# Patient Record
Sex: Male | Born: 1945 | Race: Black or African American | Hispanic: No | Marital: Married | Smoking: Current every day smoker
Health system: Southern US, Community
[De-identification: ages and names within clinical notes are randomized; demographics above are authoritative.]

## PROBLEM LIST (undated history)

## (undated) DIAGNOSIS — I10 Essential (primary) hypertension: Secondary | ICD-10-CM

## (undated) DIAGNOSIS — K219 Gastro-esophageal reflux disease without esophagitis: Secondary | ICD-10-CM

## (undated) DIAGNOSIS — F1010 Alcohol abuse, uncomplicated: Secondary | ICD-10-CM

## (undated) DIAGNOSIS — E785 Hyperlipidemia, unspecified: Secondary | ICD-10-CM

## (undated) DIAGNOSIS — I519 Heart disease, unspecified: Secondary | ICD-10-CM

## (undated) DIAGNOSIS — I2510 Atherosclerotic heart disease of native coronary artery without angina pectoris: Secondary | ICD-10-CM

## (undated) HISTORY — DX: Atherosclerotic heart disease of native coronary artery without angina pectoris: I25.10

## (undated) HISTORY — PX: CAROTID STENT: SHX1301

## (undated) HISTORY — PX: SHOULDER SURGERY: SHX246

## (undated) HISTORY — DX: Gastro-esophageal reflux disease without esophagitis: K21.9

## (undated) HISTORY — DX: Essential (primary) hypertension: I10

## (undated) HISTORY — DX: Alcohol abuse, uncomplicated: F10.10

## (undated) HISTORY — DX: Hyperlipidemia, unspecified: E78.5

## (undated) HISTORY — PX: APPENDECTOMY: SHX54

## (undated) HISTORY — DX: Heart disease, unspecified: I51.9

---

## 2003-10-10 HISTORY — PX: CARDIAC CATHETERIZATION: SHX172

## 2003-10-10 HISTORY — PX: CORONARY ANGIOPLASTY WITH STENT PLACEMENT: SHX49

## 2013-10-09 DIAGNOSIS — I2510 Atherosclerotic heart disease of native coronary artery without angina pectoris: Secondary | ICD-10-CM

## 2013-10-09 HISTORY — DX: Atherosclerotic heart disease of native coronary artery without angina pectoris: I25.10

## 2014-08-03 ENCOUNTER — Encounter: Payer: Self-pay | Admitting: Cardiovascular Disease

## 2014-08-03 ENCOUNTER — Inpatient Hospital Stay: Payer: Self-pay | Admitting: Internal Medicine

## 2014-08-03 DIAGNOSIS — I200 Unstable angina: Secondary | ICD-10-CM

## 2014-08-03 DIAGNOSIS — I25110 Atherosclerotic heart disease of native coronary artery with unstable angina pectoris: Secondary | ICD-10-CM

## 2014-08-03 HISTORY — PX: CORONARY ANGIOPLASTY WITH STENT PLACEMENT: SHX49

## 2014-08-03 HISTORY — PX: CARDIAC CATHETERIZATION: SHX172

## 2014-08-03 LAB — CK TOTAL AND CKMB (NOT AT ARMC)
CK, TOTAL: 425 U/L — AB
CK, Total: 408 U/L — ABNORMAL HIGH
CK, Total: 300 U/L
CK-MB: 4.8 ng/mL — ABNORMAL HIGH (ref 0.5–3.6)
CK-MB: 4.5 ng/mL — AB (ref 0.5–3.6)
CK-MB: 3.4 ng/mL (ref 0.5–3.6)

## 2014-08-03 LAB — COMPREHENSIVE METABOLIC PANEL
Albumin: 3.4 g/dL (ref 3.4–5.0)
Alkaline Phosphatase: 90 U/L
Anion Gap: 11 (ref 7–16)
BILIRUBIN TOTAL: 0.2 mg/dL (ref 0.2–1.0)
BUN: 15 mg/dL (ref 7–18)
CO2: 27 mmol/L (ref 21–32)
CREATININE: 0.8 mg/dL (ref 0.60–1.30)
Calcium, Total: 8.1 mg/dL — ABNORMAL LOW (ref 8.5–10.1)
Chloride: 107 mmol/L (ref 98–107)
GLUCOSE: 87 mg/dL (ref 65–99)
Osmolality: 289 (ref 275–301)
Potassium: 3.2 mmol/L — ABNORMAL LOW (ref 3.5–5.1)
SGOT(AST): 33 U/L (ref 15–37)
SGPT (ALT): 17 U/L
Sodium: 145 mmol/L (ref 136–145)
Total Protein: 6.9 g/dL (ref 6.4–8.2)

## 2014-08-03 LAB — CBC
HCT: 38 % — ABNORMAL LOW (ref 40.0–52.0)
HGB: 12 g/dL — ABNORMAL LOW (ref 13.0–18.0)
MCH: 27 pg (ref 26.0–34.0)
MCHC: 31.7 g/dL — AB (ref 32.0–36.0)
MCV: 85 fL (ref 80–100)
PLATELETS: 230 10*3/uL (ref 150–440)
RBC: 4.46 10*6/uL (ref 4.40–5.90)
RDW: 14.8 % — AB (ref 11.5–14.5)
WBC: 9.3 10*3/uL (ref 3.8–10.6)

## 2014-08-03 LAB — PRO B NATRIURETIC PEPTIDE: B-Type Natriuretic Peptide: 260 pg/mL — ABNORMAL HIGH (ref 0–125)

## 2014-08-03 LAB — TROPONIN I
TROPONIN-I: 0.04 ng/mL
Troponin-I: 0.07 ng/mL — ABNORMAL HIGH

## 2014-08-03 LAB — PROTIME-INR
INR: 0.9
Prothrombin Time: 11.7 secs (ref 11.5–14.7)

## 2014-08-04 ENCOUNTER — Other Ambulatory Visit: Payer: Self-pay | Admitting: Physician Assistant

## 2014-08-04 DIAGNOSIS — I214 Non-ST elevation (NSTEMI) myocardial infarction: Secondary | ICD-10-CM

## 2014-08-04 LAB — COMPREHENSIVE METABOLIC PANEL
ALT: 13 U/L — AB
ANION GAP: 6 — AB (ref 7–16)
Albumin: 2.8 g/dL — ABNORMAL LOW (ref 3.4–5.0)
Alkaline Phosphatase: 60 U/L
BILIRUBIN TOTAL: 0.5 mg/dL (ref 0.2–1.0)
BUN: 11 mg/dL (ref 7–18)
CHLORIDE: 111 mmol/L — AB (ref 98–107)
CO2: 26 mmol/L (ref 21–32)
CREATININE: 0.76 mg/dL (ref 0.60–1.30)
Calcium, Total: 7.6 mg/dL — ABNORMAL LOW (ref 8.5–10.1)
Glucose: 94 mg/dL (ref 65–99)
Osmolality: 284 (ref 275–301)
Potassium: 3.6 mmol/L (ref 3.5–5.1)
SGOT(AST): 25 U/L (ref 15–37)
SODIUM: 143 mmol/L (ref 136–145)
Total Protein: 5.8 g/dL — ABNORMAL LOW (ref 6.4–8.2)

## 2014-08-04 LAB — CBC WITH DIFFERENTIAL/PLATELET
Basophil #: 0.1 10*3/uL (ref 0.0–0.1)
Basophil %: 0.8 %
EOS ABS: 0.4 10*3/uL (ref 0.0–0.7)
Eosinophil %: 5.1 %
HCT: 35.8 % — ABNORMAL LOW (ref 40.0–52.0)
HGB: 11.4 g/dL — ABNORMAL LOW (ref 13.0–18.0)
LYMPHS ABS: 1.4 10*3/uL (ref 1.0–3.6)
Lymphocyte %: 17.2 %
MCH: 27.7 pg (ref 26.0–34.0)
MCHC: 32 g/dL (ref 32.0–36.0)
MCV: 87 fL (ref 80–100)
MONOS PCT: 6.3 %
Monocyte #: 0.5 x10 3/mm (ref 0.2–1.0)
NEUTROS PCT: 70.6 %
Neutrophil #: 5.8 10*3/uL (ref 1.4–6.5)
Platelet: 201 10*3/uL (ref 150–440)
RBC: 4.14 10*6/uL — AB (ref 4.40–5.90)
RDW: 15.3 % — ABNORMAL HIGH (ref 11.5–14.5)
WBC: 8.3 10*3/uL (ref 3.8–10.6)

## 2014-08-04 LAB — LIPID PANEL
Cholesterol: 122 mg/dL (ref 0–200)
HDL: 47 mg/dL (ref 40–60)
LDL CHOLESTEROL, CALC: 57 mg/dL (ref 0–100)
TRIGLYCERIDES: 89 mg/dL (ref 0–200)
VLDL CHOLESTEROL, CALC: 18 mg/dL (ref 5–40)

## 2014-08-04 NOTE — Progress Notes (Signed)
Please set patient up for cardiac rehab. Thanks!

## 2014-08-05 ENCOUNTER — Telehealth: Payer: Self-pay | Admitting: *Deleted

## 2014-08-05 NOTE — Telephone Encounter (Signed)
Order faxed for cardiac rehab

## 2015-01-30 NOTE — Discharge Summary (Signed)
PATIENT NAME:  Bryan Calderon, Bryan Calderon MR#:  [id number] DATE OF BIRTH:  08-May-1946  DATE OF ADMISSION:  08/03/2014 DATE OF DISCHARGE:  08/04/2014  DISCHARGE DIAGNOSES: 1.  Non-ST elevation myocardial infarction, status post catheterization and percutaneous coronary intervention with stent.  2.  Hypertension.  3.  Hyperlipidemia.  4.  Ongoing tobacco abuse.   CONSULTATIONS: Muhammed A. Arida, cardiology.   PROCEDURE: Cardiac catheterization by Dr. Fletcher Anon performed 08/03/2014, showing severe one-vessel coronary artery disease with mid left anterior descending artery in-stent restenosis, moderate left circumflex and right coronary artery disease. Drug-eluting stent performed on the 90% lesion in the mid left anterior descending artery. Following the procedure, there was 0% residual stenosis.   HISTORY OF PRESENT ILLNESS:  This very pleasant 69 year old African-American gentleman with past medical history of coronary artery disease status post stenting 10 years prior to presentation, hypertension, and hyperlipidemia presents with an episode of midsternal chest pain with radiation to the shoulders and arms.   HOSPITAL COURSE BY PROBLEM:  1.  NSTEMI:  EKG on presentation was highly abnormal with deeply inverted T waves in anterior leads. Cardiac enzymes were slightly positive with troponin increasing from 0.02 to 0.07 prior to procedure. The patient was seen by Dr. Fletcher Anon, who took him to the catheterization lab, where a 90% in-stent stenosis of the prior LAD stent was diagnosed and treated with placement of a new drug-eluting stent. After the procedure, the patient was chest pain-free. He was discharged on Effient and aspirin. He continues on lisinopril, Lipitor, and metoprolol. He is in the process of moving from [place] to [place]. He is only in [place] on the weekends, so he will be following up with his primary cardiologist within 1 week of discharge. He was given contact information for our local  cardiologists in case he needed to contact them while he is in [place]. Once he establishes in [place], he will make an appointment with either Dr. Fletcher Anon or Dr. Rockey Situ, who saw him in the hospital.  2.  Hypertension:  The patient will continue on hydrochlorothiazide, lisinopril, and metoprolol. This will be monitored by his primary cardiologist and primary care physician as an outpatient.  3.  Hyperlipidemia:  LDL was 57, total cholesterol 122. He continues on Lipitor.  4.  Ongoing tobacco abuse:  Cessation counseling provided while inpatient. The patient reports that he has quit smoking as of this admission.   DISCHARGE PHYSICAL EXAMINATION: VITAL SIGNS:  Temperature 97.9, pulse 51, respirations 15, blood pressure 131/58, oxygenation 97% on room air.  GENERAL:  No acute distress.  CARDIOVASCULAR:  Regular rate and rhythm. No murmurs, rubs, or gallops. No peripheral edema. Peripheral pulses are 2+.  PULMONARY:  Lungs are clear to auscultation bilaterally with good air movement.   LABORATORY DATA:  Sodium was 143, potassium 3.6, chloride 101, bicarbonate 26, BUN 11, creatinine 0.76, glucose 94. LFTs were normal. White blood cells were 8.3, hemoglobin 11.4, platelets 201,000, and MCV 87.   DISCHARGE MEDICATIONS: 1.  Hydrochlorothiazide 25 mg 1 tablet daily.  2.  Lisinopril 10 mg 1 tablet daily.  3.  Nitroglycerin 0.4 mg 1 tablet every 5 minutes as needed for angina.  4.  Atorvastatin 40 mg 1 tablet daily.  5.  Aspirin 81 mg 1 tablet daily.  6.  Prasugrel 10 mg 1 tablet daily.  7.  Metoprolol 50 mg 1 tablet twice a day.   CONDITION ON DISCHARGE:  Stable.   DISPOSITION:  Discharged to home with no home health requirements.   DISCHARGE  INSTRUCTIONS: DIET:  Heart healthy.  ACTIVITY:  As tolerated.  TIMEFRAME FOR APPOINTMENT:  Please make an appointment with your primary care physician within 1 to 2 weeks of discharge.   TIME SPENT ON DISCHARGE:  40 minutes.     ____________________________ Earleen Newport. Volanda Napoleon, MD cpw:nb D: 08/05/2014 14:58:25 ET T: 08/05/2014 23:03:39 ET JOB#: [id number]  cc: Barnetta Chapel P. Volanda Napoleon, MD, <Dictator> Aldean Jewett MD ELECTRONICALLY SIGNED 08/06/2014 14:25

## 2015-01-30 NOTE — H&P (Signed)
PATIENT NAME:  Bryan Calderon, Bryan Calderon MR#:  [id number] DATE OF BIRTH:  Jul 14, 1946  DATE OF ADMISSION:  08/03/2014  REFERRING PHYSICIAN:  Gretchen Short. Beather Arbour, MD   PRIMARY CARE PHYSICIAN:  Nonlocal in [place]:  Juluis Mire, MD   CHIEF COMPLAINT:  Midsternal chest pain.   HISTORY OF PRESENT ILLNESS:  Bryan Calderon is a 69 year old African-American male with past medical history significant for hypertension, coronary artery disease status post stent 10 years ago, and history of hyperlipidemia, who presents with the complaints of episode of midsternal chest pain with radiation to the shoulders and arm. The patient stated that at around 1:30 a.m. he developed sudden onset of severe midsternal chest pain that was sharp in nature with radiation to the right shoulder and arm and associated with mild shortness of breath. Hence, he came to the Emergency Room accompanied by his wife. En route to the Emergency Room, his chest pain kind of decreased significantly. There was mild associated diaphoresis with the chest pain episodes. Otherwise, denies any dizziness or loss of consciousness. No nausea. No vomiting. No diarrhea. No history of recent fever or cough. The patient stated that he has been having some chest pains on and off since about one week, one episode about a week ago and another episode yesterday in the afternoon, which kind of resolved on their own. The patient did not seek any medical attention for these 2 episodes.   In the Emergency Room, the patient was evaluated by the ED physician and found to have elevated blood pressure and was given nitroglycerin, aspirin, and subcutaneous Lovenox, following which his symptoms resolved, and currently he is chest pain-free.   PAST MEDICAL HISTORY: 1.  Hypertension, history of coronary artery disease status post stent, which was placed about 10 years ago.  2.  History of hyperlipidemia.   PAST SURGICAL HISTORY:  1.  Status post appendectomy.   2.  Status post surgery for right arm fracture.    HOME MEDICATIONS:   1.  Blood pressure medication. He believes that it is lisinopril, but does not know the dose of the medication.  2.  Cholesterol medication. He does not know the name of the medication.  3.  Hydrochlorothiazide.  4.  Aspirin.   ALLERGIES:  No known drug allergies.   SOCIAL HISTORY:  He is married and lives with his wife. He is a resident of [place] and planning to move to [place] shortly. 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 History of smoking half pack per day for the past many years. History of alcohol use: He drinks about 3 times a week, that is vodka. He also has a history of marijuana usage, last usage yesterday.    FAMILY HISTORY:  Nonsignificant for any cardiac or cerebrovascular disease.   REVIEW OF SYSTEMS:  CONSTITUTIONAL:  Negative for fever, fatigue, generalized weakness. No abnormal weight gain or weight loss.  EYES:  Negative for blurred vision or double vision. No pain. No redness. No inflammation.  EARS, NOSE, AND THROAT:  Negative for tinnitus, ear pain, hearing loss, epistaxis, nasal discharge, or difficulty swallowing.  RESPIRATORY:  Negative for cough, wheezing, shortness of breath, painful respirations, or hemoptysis.  CARDIOVASCULAR:  Retrosternal chest pain episode around 1:30 a.m. as noted in the HPI, positive mild shortness of breath with that chest pain episode. No diaphoresis. No dizziness. No loss of consciousness. No history of any paroxysmal nocturnal dyspnea or orthopnea. No leg edema.   GASTROINTESTINAL:  Negative for nausea, vomiting, diarrhea, abdominal pain, GERD symptoms, hematemesis, melena, or rectal bleeding.  GENITOURINARY:  Negative for dysuria, hematuria, frequency, or urgency.  ENDOCRINE:  Negative for polyuria or polydipsia. No heat or cold intolerance.  HEMATOLOGIC AND ONCOLOGIC:  Negative for anemia or easy bruising or bleeding.  SKIN:  Negative  for acne, skin rash, or lesions.  MUSCULOSKELETAL:  Negative for any back pain or neck pain. No history of any arthritis or joint swellings.  NEUROLOGICAL:  Negative for focal weakness or numbness. No history of CVA, TIA, or seizure disorder.  PSYCHIATRIC:  Negative for anxiety, insomnia, or depression.   PHYSICAL EXAMINATION: VITAL SIGNS:  Temperature 98.1 degrees Fahrenheit, pulse rate 60 per minute and regular, respirations 20 per minute, blood pressure 196/85, oxygen saturation 97% on room air.   GENERAL:  Well developed, well nourished, pleasant and cooperative, comfortably lying in the bed, not in any distress, alert, awake.  HEAD:  Atraumatic, normocephalic.  EYES:  Pupils are equal and reactive to light and accommodation. No conjunctival pallor. No scleral icterus. Extraocular movements are intact.  NOSE:  No nasal lesions. No drainage.  EARS:  No drainage. No external lesions.  ORAL CAVITY:  No mucosal lesions. No exudates. No lumps.  NECK:  Supple. No JVD. No thyromegaly. No carotid bruit. Range of motion is normal.  RESPIRATORY:  Good respiratory effort. Not using any accessory muscles of respiration. Bilateral vesicular breath sounds present. No rales or rhonchi present.  CARDIOVASCULAR: S1, S2 regular. No murmurs, gallops, or clicks. Pedal pulses equal at carotid, femoral, and pedal pulses. No peripheral edema.  GASTROINTESTINAL:  Abdomen is soft and nontender. No hepatosplenomegaly. Bowel sounds equal in all four quadrants present. No tenderness. No guarding. No rigidity.  GENITOURINARY:  Deferred.  MUSCULOSKELETAL:  No joint swelling or tenderness. No effusions. Range of motion is adequate. Strength and tone are equal bilaterally in upper and lower extremities.   SKIN:  Inspection within normal limits.  LYMPHATIC:  Negative for cervical lymphadenopathy.  VASCULAR:  Good dorsalis pedis and posterior tibial pulses.  NEUROLOGICAL:  Cranial nerves II through XII are grossly intact.  The patient is alert, awake, and oriented x 3. Deep tendon reflexes are 2+ bilaterally and symmetrical. Motor strength is 5/5 in both upper and lower extremities bilaterally.   PSYCHIATRIC:  Judgment and insight are adequate. Alert and oriented x 3. Memory and mood are within normal limits.    LABORATORY DATA:  BNP is 260, serum glucose 87, BUN 15, creatinine 0.8, serum sodium 145, potassium 3.2, chloride 107, bicarbonate 27, calcium 8.1, total protein 6.9, albumin 3.4, bilirubin 0.2, alkaline phosphatase 90, AST 33, and ALT 17. Troponin is less than 0.02. WBC is 9.3, hemoglobin 12.0, hematocrit 38.0, platelet count 230. Prothrombin time is 11.7, INR 0.9.   IMAGING STUDIES:  Chest x-ray:  No active cardiopulmonary disease.   EKG:  Normal sinus rhythm with ventricular rate of 67 beats per minute, right bundle branch block pattern, and T-wave inversions in V3 to V6.   ASSESSMENT AND PLAN:  A 68 year old African-American male with past medical history significant for hypertension, coronary artery disease status post stent, and hyperlipidemia, who presents with the complaints of retrosternal chest pain with radiation to the right arm/shoulder.   1.  Retrosternal chest pain concerning for unstable angina. Plan:  Admit to telemetry. Aspirin, beta blocker, nitroglycerin, subcutaneous Lovenox, statin, cycle cardiac enzymes, echocardiogram, and cardiology consultation.   2.  History of coronary artery disease status post stent. Plan:  Nitroglycerin, aspirin, beta blocker, statin, echocardiogram, cardiology consultation, cycle cardiac enzymes.  3.  Hypertension on medications. Blood pressure is high. Resume home medications, follow blood pressure monitoring, adjust medications further for better BP control.  4.  Hypokalemia, mild, likely secondary due to diuretic usage. Potassium supplementation, follow BMP.   5.  History of smoking, current. Counseled to quit smoking.   6.  History of marijuana usage. Check  urine drug screen. Counseled about quitting. Monitor.  7.  Deep vein thrombosis prophylaxis with subcutaneous heparin and subcutaneous Lovenox.  8.  Gastrointestinal prophylaxis with Protonix.   CODE STATUS:  Full code.   TIME SPENT:  55 minutes.   ____________________________ Juluis Mire, MD enr:nb D: 08/03/2014 05:22:26 ET T: 08/03/2014 05:40:44 ET JOB#: [id number]  cc: Juluis Mire, MD, <Dictator> Unknown cc  Juluis Mire MD ELECTRONICALLY SIGNED 08/06/2014 19:48

## 2015-01-30 NOTE — Consult Note (Signed)
PATIENT NAME:  Bryan Calderon, Bryan Calderon MR#:  [id number] DATE OF BIRTH:  10/11/1945  DATE OF CONSULTATION:  08/03/2014  REQUESTING PHYSICIAN:  Azucena Freed, MD   CONSULTING PHYSICIAN:  Samuele Storey A. Fletcher Anon, MD   REASON FOR CONSULTATION: Unstable angina.   HISTORY OF PRESENT ILLNESS: This is a 69 year old African American male with known history of coronary artery disease, status post PCI and stent placement 10 years ago in [place], hypertension, hyperlipidemia and tobacco use.  He is in the process of moving from [place] to [place]. He presented to the Emergency Room with severe substernal chest tightness radiating to his shoulder. This was sudden and happened around 1:30 in the morning.  The discomfort is described as heavy tightness and not sharp. It did not radiate to his back. It lasted for more than 1 hour. He had similar episodes in the last week, mostly at rest.  He complains of increased exertional dyspnea but he has not had chest pain with activities lately.  He was hypertensive on presentation. Symptoms improved with aspirin and nitroglycerin. He is currently chest pain free. He denies cocaine use. He has not missed any of his medications per his report.   PAST MEDICAL HISTORY:  1.  Coronary artery disease, status post stent placement more than 10 years ago with no cardiac event since then.  2.  Hypertension.  3.  Hyperlipidemia.  4.  Tobacco use.   HOME MEDICATIONS:  Not exactly known, but he is supposed to be on lisinopril, hydrochlorothiazide, aspirin, and cholesterol medication.   ALLERGIES: No known drug allergies.   SOCIAL HISTORY: He is married and lives with his wife. He is a resident of  [place] and planning to move to [place].  He works at the [employer] in [place].  He smokes 1 pack per day for many years.  He drinks 3 times per week mostly Vodka.  He admits to occasional marijuana use, but no cocaine.   FAMILY HISTORY: Negative for coronary artery disease.   REVIEW OF  SYSTEMS: A 10 point review of systems review of systems was performed.  It is negative other than what is mentioned in the history of present illness.   PHYSICAL EXAMINATION:  GENERAL: The patient is well-developed and appears to be at his stated age and in no acute distress.  VITAL SIGNS: On presentation, he was hypertensive with a blood pressure of 196/85. Currently his temperature is 97.7, pulse 56, respiratory rate 16, blood pressure is 148/68, and oxygen saturation is 98% on room air.  HEENT: Normocephalic, atraumatic.  NECK: No JVD or carotid bruits.  RESPIRATORY: Normal respiratory effort with no use of accessory muscles. Auscultation reveals normal breath sounds.  CARDIOVASCULAR: Normal PMI. Normal S1 and S2 with no gallops or murmurs.  ABDOMEN: Benign, nontender, and nondistended.  EXTREMITIES: No clubbing, cyanosis, or edema.  SKIN: Warm and dry with no rash.  PSYCHIATRIC: He is alert, oriented x 3 with normal mood and affect.   LABORATORY, DIAGNOSTIC, AND RADIOLOGICAL DATA:  His troponin was less than 0.02. CK-MB was slightly up at 4.8. Renal function is normal. Hemoglobin is 12. ECG showed sinus rhythm with right bundle branch block and left ventricular hypertrophy. Extensive anterior T-wave inversion could be due to anterior ischemia versus repolarization abnormalities related to LVH.  Chest x-ray showed no acute cardiopulmonary disease.   ASSESSMENT:   1.  Chest pain highly suggestive of unstable angina.  2.  Uncontrolled hypertension.  3.  Hyperlipidemia.  4.  Tobacco use.   RECOMMENDATIONS:  The patient's presentation is highly worrisome for unstable angina. He was hypertensive on presentation.  EKG is highly abnormal with deeply inverted T-waves in the anterior leads, worrisome for ischemia in the LAD territory. Given his known history of coronary artery disease and current presentation, I recommend proceeding with cardiac catheterization and possible coronary intervention.  Risks, benefits and alternatives were discussed with the patient extensively.  I recommend blood pressure control. Resume treatment with an ACE inhibitor. Continue metoprolol and hydrochlorothiazide. We can consider switching metoprolol to carvedilol for better blood pressure control. Continue treatment with a statin. Smoking cessation was strongly advised. Further recommendations to follow after cardiac catheterization.     ____________________________ Mertie Clause Fletcher Anon, MD maa:DT D: 08/03/2014 08:12:00 ET T: 08/03/2014 08:38:29 ET JOB#: [id number]  cc: Rogue Jury A. Fletcher Anon, MD, <Dictator> Wellington Hampshire MD ELECTRONICALLY SIGNED 08/18/2014 9:04

## 2015-01-30 NOTE — Consult Note (Signed)
Brief Consult Note: Diagnosis: unstable angina.   Patient was seen by consultant.   Consult note dictated.   Comments: cardiac cath today.  Electronic Signatures: Kathlyn Sacramento (MD)  (Signed 26-Oct-15 08:05)  Authored: Brief Consult Note   Last Updated: 26-Oct-15 08:05 by Kathlyn Sacramento (MD)

## 2015-07-19 ENCOUNTER — Encounter: Payer: Self-pay | Admitting: Primary Care

## 2015-07-19 ENCOUNTER — Ambulatory Visit (INDEPENDENT_AMBULATORY_CARE_PROVIDER_SITE_OTHER): Payer: Federal, State, Local not specified - PPO | Admitting: Primary Care

## 2015-07-19 VITALS — BP 146/76 | HR 55 | Temp 97.9°F | Ht 70.0 in | Wt 211.8 lb

## 2015-07-19 DIAGNOSIS — R229 Localized swelling, mass and lump, unspecified: Secondary | ICD-10-CM

## 2015-07-19 DIAGNOSIS — I10 Essential (primary) hypertension: Secondary | ICD-10-CM | POA: Diagnosis not present

## 2015-07-19 DIAGNOSIS — E785 Hyperlipidemia, unspecified: Secondary | ICD-10-CM

## 2015-07-19 DIAGNOSIS — I519 Heart disease, unspecified: Secondary | ICD-10-CM

## 2015-07-19 DIAGNOSIS — R222 Localized swelling, mass and lump, trunk: Secondary | ICD-10-CM

## 2015-07-19 NOTE — Progress Notes (Signed)
Subjective:    [name], male    DOB: 1945/10/31, 69 y.o.   MRN: [id number]  HPI  Bryan Calderon is a 69 year old male who presents today to establish care and discuss the problems mentioned below. Will obtain old records. His last physical was over one year ago.   1) Essential Hypertension: Diagnosed years ago. Currently managed on HCTZ 25 mg, lisinopril 10 mg, and metoprolol 50 mg BID. Denies chest pain, headaches, dizziness.  2) Hyperlipidemia: Diagnosed years ago. Currently managed on Lipitor 40 mg. Denies myalgias.  3) Heart Disease: History of 2 blockages with 1 stent placement. He is currently on Effient 10 mg and aspirin 81 mg. He has not followed with cardiology for over 1 year since moving to this area. Denies chest pain.   4) Mass on back: Present for the past several years and has been gradually enlarging. It is located just left to his T-Spine around T8-10. His wife squeezed it several weeks ago in order to express fluid. She was only able to expressed a light amount of blood. The mass hasn't bothered him for years but it started to become tender to touch just recently before his wife squeezed it. Denies fevers chills.   Review of Systems  Constitutional: Negative for unexpected weight change.  HENT: Negative for rhinorrhea.   Respiratory: Negative for cough and shortness of breath.   Cardiovascular: Negative for chest pain.  Gastrointestinal: Negative for diarrhea and constipation.  Genitourinary: Negative for difficulty urinating.  Musculoskeletal: Negative for myalgias and arthralgias.  Skin: Negative for rash.       Mass to t-spine.  Allergic/Immunologic: Negative for environmental allergies.  Neurological: Negative for dizziness and headaches.       Numbness and tingling to left upper extremity with radiation to fingers.   Psychiatric/Behavioral:       Denies concerns for anxiety and depression.       Past Medical History  Diagnosis Date  .  Alcohol abuse   . Hyperlipidemia   . Hypertension   . GERD (gastroesophageal reflux disease)   . Heart disease     Social History   Social History  . Marital Status: Married    Spouse Name: N/A  . Number of Children: N/A  . Years of Education: N/A   Occupational History  . Not on file.   Social History Main Topics  . Smoking status: Current Every Day Smoker    Types: Cigarettes  . Smokeless tobacco: Not on file     Comment: about 3 cigarettes a day  . Alcohol Use: 0.0 oz/week    0 Standard drinks or equivalent per week     Comment: weekend socially  . Drug Use: No  . Sexual Activity: Not on file   Other Topics Concern  . Not on file   Social History Narrative   Married.   8 children   Retired. Looking for part time work.   Enjoys fishing, sports, spending time with family.    Past Surgical History  Procedure Laterality Date  . Carotid stent  2015 and 2004  . Shoulder surgery Bilateral     Rotator Cuff Repair  . Appendectomy      Family History  Problem Relation Age of Onset  . Heart disease Maternal Grandmother     No Known Allergies  No current outpatient prescriptions on file prior to visit.   No current facility-administered medications on file prior to visit.    BP  146/76 mmHg  Pulse 55  Temp(Src) 97.9 F (36.6 C) (Oral)  Ht 5\' 10"  (1.778 m)  Wt 211 lb 12.8 oz (96.072 kg)  BMI 30.39 kg/m2  SpO2 97%    Objective:   Physical Exam  Constitutional: He is oriented to person, place, and time. He appears well-nourished.  Cardiovascular: Normal rate and regular rhythm.   Pulmonary/Chest: Effort normal and breath sounds normal.  Neurological: He is alert and oriented to person, place, and time.  Skin: Skin is warm and dry. There is erythema.  2.5 cm circular mass just left of T-Spine at level of T8-10. Tender upon palpation, slight erythema. No drainage. Does not appear to be infected, only irritated from recent puncture.  Psychiatric: He has a  normal mood and affect.          Assessment & Plan:

## 2015-07-19 NOTE — Assessment & Plan Note (Signed)
History of 2 blockages with 1 stent in place. Currently managed on Effient per prior cardiologist. No recent cardiology follow up since moving to the area. Referral placed for cardiology follow up.

## 2015-07-19 NOTE — Patient Instructions (Signed)
Stop by the front and speak with Rosaria Ferries regarding your referral to Cardiology and for the Ultrasound of your back.  I will call you once I receive the results of your ultrasound.  Please schedule a physical with me in the next 3 months. You will also schedule a lab only appointment one week prior. We will discuss your lab results during your physical.  It was a pleasure to meet you today! Please don't hesitate to call me with any questions. Welcome to [facility]!

## 2015-07-19 NOTE — Assessment & Plan Note (Signed)
Managed on Lipitor 40 mg. Denies myalgias. Last lipid panel one year ago. Will recheck at upcoming physical.

## 2015-07-19 NOTE — Assessment & Plan Note (Signed)
Diagnosed years ago. Managed on lisinopril 10 mg, HCTZ 25 mg, and Lopressor 50 mg BID. Denies CP, SOB, dizziness. BP stable per current guidelines today. Will continue to monitor.

## 2015-07-19 NOTE — Progress Notes (Signed)
Pre visit review using our clinic review tool, if applicable. No additional management support is needed unless otherwise documented below in the visit note. 

## 2015-07-19 NOTE — Assessment & Plan Note (Signed)
Present just left of T-Spine at T8-10 area. Overall enlarging, now tender. Suspect lipoma by feel. Will obtain US to verify.

## 2015-07-23 ENCOUNTER — Other Ambulatory Visit: Payer: Self-pay | Admitting: Primary Care

## 2015-07-23 ENCOUNTER — Ambulatory Visit
Admission: RE | Admit: 2015-07-23 | Discharge: 2015-07-23 | Disposition: A | Discharge status: Home / Self Care | Payer: Federal, State, Local not specified - PPO | Source: Ambulatory Visit | Attending: Primary Care | Admitting: Primary Care

## 2015-07-23 ENCOUNTER — Telehealth: Payer: Self-pay | Admitting: Primary Care

## 2015-07-23 DIAGNOSIS — R222 Localized swelling, mass and lump, trunk: Secondary | ICD-10-CM

## 2015-07-23 DIAGNOSIS — L723 Sebaceous cyst: Secondary | ICD-10-CM | POA: Diagnosis not present

## 2015-07-23 DIAGNOSIS — R229 Localized swelling, mass and lump, unspecified: Secondary | ICD-10-CM | POA: Diagnosis present

## 2015-07-23 NOTE — Telephone Encounter (Signed)
Order changed. Please notify [place]. Thanks.

## 2015-07-23 NOTE — Telephone Encounter (Signed)
Megan from [facility] ultrasound request new ultrasound order. [place] Chest. Dx code is fine. Order needs to be changed due b/c its a area of ribs.  Please call when order is changed # [phone]

## 2015-07-27 ENCOUNTER — Other Ambulatory Visit: Payer: Self-pay | Admitting: Primary Care

## 2015-07-27 ENCOUNTER — Telehealth: Payer: Self-pay | Admitting: Primary Care

## 2015-07-27 DIAGNOSIS — R222 Localized swelling, mass and lump, trunk: Secondary | ICD-10-CM

## 2015-07-27 MED ORDER — DOXYCYCLINE HYCLATE 100 MG PO TABS
100.0000 mg | ORAL_TABLET | Freq: Two times a day (BID) | ORAL | Status: DC
Start: 2015-07-27 — End: 2015-11-22

## 2015-07-27 NOTE — Telephone Encounter (Signed)
Patient presented to clinic lobby requesting results be re-read from recent US screening. Patient also reports the mass had enlarged. Patient was pulled back to an exam room and his mass has increased from 2.2 cm to 4 cm. Due to [place] reading of possible abscess vs sebaceous cyst, and rapidly enlarging mass, will treat with 10 day course of Doxycycline. He is to follow up in 2 weeks for re-evaluation.

## 2015-07-29 ENCOUNTER — Telehealth: Payer: Self-pay | Admitting: Primary Care

## 2015-07-29 NOTE — Telephone Encounter (Signed)
Pt called stating his rx is not a [facility] re send to [address] [phone] Please advise

## 2015-07-29 NOTE — Telephone Encounter (Signed)
[facility] [place] [facility] [address]. I do not need to send a new RX. Will you please notify him? Thanks.

## 2015-07-30 NOTE — Telephone Encounter (Signed)
[facility] [address]

## 2015-08-03 ENCOUNTER — Ambulatory Visit: Payer: Federal, State, Local not specified - PPO

## 2015-08-06 ENCOUNTER — Ambulatory Visit: Payer: Federal, State, Local not specified - PPO

## 2015-09-09 ENCOUNTER — Ambulatory Visit (INDEPENDENT_AMBULATORY_CARE_PROVIDER_SITE_OTHER): Payer: Federal, State, Local not specified - PPO | Admitting: Cardiovascular Disease

## 2015-09-09 ENCOUNTER — Encounter: Payer: Self-pay | Admitting: Cardiovascular Disease

## 2015-09-09 VITALS — BP 142/82 | HR 56 | Ht 69.0 in | Wt 212.2 lb

## 2015-09-09 DIAGNOSIS — Z720 Tobacco use: Secondary | ICD-10-CM

## 2015-09-09 DIAGNOSIS — F17200 Nicotine dependence, unspecified, uncomplicated: Secondary | ICD-10-CM

## 2015-09-09 DIAGNOSIS — I2510 Atherosclerotic heart disease of native coronary artery without angina pectoris: Secondary | ICD-10-CM

## 2015-09-09 DIAGNOSIS — J449 Chronic obstructive pulmonary disease, unspecified: Secondary | ICD-10-CM | POA: Insufficient documentation

## 2015-09-09 DIAGNOSIS — Z955 Presence of coronary angioplasty implant and graft: Secondary | ICD-10-CM | POA: Diagnosis not present

## 2015-09-09 DIAGNOSIS — I10 Essential (primary) hypertension: Secondary | ICD-10-CM | POA: Diagnosis not present

## 2015-09-09 DIAGNOSIS — I519 Heart disease, unspecified: Secondary | ICD-10-CM | POA: Diagnosis not present

## 2015-09-09 DIAGNOSIS — J432 Centrilobular emphysema: Secondary | ICD-10-CM

## 2015-09-09 DIAGNOSIS — E785 Hyperlipidemia, unspecified: Secondary | ICD-10-CM

## 2015-09-09 NOTE — Patient Instructions (Addendum)
You are doing well.  Sugars are perfect, Cholesterol is PERFECT! Blood pressure great!  Just need to work on smoking! Walk daily, low carb diet  Sex is OK, in moderation!  ;-) Try cialis 2 up to 4 pills at a time as needed  Please call us if you have new issues that need to be addressed before your next appt.  Your physician wants you to follow-up in: 12 months.  You will receive a reminder letter in the mail two months in advance. If you don't receive a letter, please call our office to schedule the follow-up appointment.

## 2015-09-09 NOTE — Assessment & Plan Note (Signed)
We have encouraged him to continue to work on weaning his cigarettes and smoking cessation. He will continue to work on this and does not want any assistance with chantix.  

## 2015-09-09 NOTE — Assessment & Plan Note (Signed)
Long discussion concerning his Effient He stopped this one month ago. We'll continue on aspirin

## 2015-09-09 NOTE — Progress Notes (Signed)
[name], male    DOB: [birth year], 69 y.o.   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 Bryan Calderon or his coronary artery disease. Also a history of hyperlipidemia, hypertension  Reports that Bryan Calderon continues to smoke one half pack per day, has been smoking for many decades. Has been trying to quit but having difficulty. 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  Weight has been trending upwards, retired, needs more hobbies. No regular exercise program but does try to stay active. Denies any chest pain or shortness of breath concerning for angina. Previously had chest pain prior to his stent, no recent symptoms.  Lab work reviewed with him showing well-controlled cholesterol in the 120 range, LDL in the 50 range No diabetes  Bryan Calderon would like to stop his Effient as it is very expensive for him Bryan Calderon prefers to stay on aspirin. In fact has not been taking Effient for 1 month Bryan Calderon reports his blood pressures relatively well-controlled at home  Review of cardiac catheterization with him from 08/03/2014 shows a 90% proximal to mid LAD lesion with stent placed, 0 residual stenosis, 20% left circumflex disease, 3240% mid and distal RCA disease, 40 and 60% OM1 and OM 3 disease  EKG on today's visit shows normal sinus rhythm with rate 56 bpm, nonspecific T wave abnormality anterolateral leads,II, III,  and aVF EKG is actually improved compared to 2010   No Known Allergies  Current Outpatient Prescriptions on File Prior to Visit  Medication Sig Dispense Refill  . atorvastatin (LIPITOR) 40 MG tablet Take 40 mg by mouth at bedtime.  8  . doxycycline (VIBRA-TABS) 100 MG tablet Take 1 tablet (100 mg total) by mouth 2 (two) times daily. 20 tablet 0  . EFFIENT 10 MG TABS tablet Take 10  mg by mouth daily.     . hydrochlorothiazide (HYDRODIURIL) 25 MG tablet TAKE 1 TABLET BY MOUTH DAILY FOR SWELLING AND BLOOD PRESSURE  9  . lisinopril (PRINIVIL,ZESTRIL) 10 MG tablet Take 10 mg by mouth daily.  6  . metoprolol (LOPRESSOR) 50 MG tablet Take 50 mg by mouth 2 (two) times daily.  2  . nitroGLYCERIN (NITROSTAT) 0.4 MG SL tablet TAKE 1 TABLET EVERY 5 MINS AS NEEDED AS DIRECTED  0  . vitamin E 400 UNIT capsule Take 400 Units by mouth. Taking 3 times a week     No current facility-administered medications on file prior to visit.    Past Medical History  Diagnosis Date  . Alcohol abuse   . Hyperlipidemia   . Hypertension   . GERD (gastroesophageal reflux disease)   . Heart disease   . Coronary artery disease 2015    Past Surgical History  Procedure Laterality Date  . Carotid stent  2015 and 2004  . Shoulder surgery Bilateral     Rotator Cuff Repair  . Appendectomy    . Cardiac catheterization  08/03/2014  . Cardiac catheterization  2005    [place], [place]   . Coronary angioplasty with stent placement  08/03/2014  . Coronary angioplasty with stent placement  2005    [place], [place]     Social History  reports that Bryan Calderon has been smoking Cigarettes.  Bryan Calderon has a 35 pack-year smoking history. Bryan Calderon does not have any smokeless tobacco history on  file. Bryan Calderon reports that Bryan Calderon drinks alcohol. Bryan Calderon reports that Bryan Calderon does not use illicit drugs.  Family History family history includes Heart attack in his maternal grandmother; Heart disease in his maternal grandmother.   Review of Systems  Constitutional: Negative.   Respiratory: Negative.   Cardiovascular: Negative.   Gastrointestinal: Negative.   Musculoskeletal: Negative.   Neurological: Negative.   Hematological: Negative.   Psychiatric/Behavioral: Negative.   All other systems reviewed and are negative.   BP 142/82 mmHg  Pulse 56  Ht 5\' 9"  (1.753 m)  Wt 212 lb 4 oz (96.276 kg)  BMI 31.33 kg/m2  Physical Exam  Constitutional: Bryan Calderon  is oriented to person, place, and time. Bryan Calderon appears well-developed and well-nourished.  HENT:  Head: Normocephalic.  Nose: Nose normal.  Mouth/Throat: Oropharynx is clear and moist.  Eyes: Conjunctivae are normal. Pupils are equal, round, and reactive to light.  Neck: Normal range of motion. Neck supple. No JVD present.  Cardiovascular: Normal rate, regular rhythm, normal heart sounds and intact distal pulses.  Exam reveals no gallop and no friction rub.   No murmur heard. Pulmonary/Chest: Effort normal and breath sounds normal. No respiratory distress. Bryan Calderon has no wheezes. Bryan Calderon has no rales. Bryan Calderon exhibits no tenderness.  Abdominal: Soft. Bowel sounds are normal. Bryan Calderon exhibits no distension. There is no tenderness.  Musculoskeletal: Normal range of motion. Bryan Calderon exhibits no edema or tenderness.  Lymphadenopathy:    Bryan Calderon has no cervical adenopathy.  Neurological: Bryan Calderon is alert and oriented to person, place, and time. Coordination normal.  Skin: Skin is warm and dry. No rash noted. No erythema.  Psychiatric: Bryan Calderon has a normal mood and affect. His behavior is normal. Judgment and thought content normal.

## 2015-09-09 NOTE — Assessment & Plan Note (Signed)
He denies any significant shortness of breath on exertion. Recommended smoking cessation Cessation strategies discussed with him

## 2015-09-09 NOTE — Assessment & Plan Note (Signed)
Blood pressure is well controlled on today's visit. No changes made to the medications. 

## 2015-09-09 NOTE — Assessment & Plan Note (Signed)
Stents to his LAD 10 years ago, in-stent restenosis 2015 with drug-eluting stent placed over his prior stent, also with mild to moderate RCA and circumflex disease. Strongly recommended smoking cessation Currently with no symptoms of angina. No further testing at this time

## 2015-09-09 NOTE — Assessment & Plan Note (Signed)
Cholesterol is at goal on the current lipid regimen. No changes to the medications were made.  

## 2015-10-05 ENCOUNTER — Other Ambulatory Visit: Payer: Self-pay | Admitting: Primary Care

## 2015-10-05 DIAGNOSIS — E785 Hyperlipidemia, unspecified: Secondary | ICD-10-CM

## 2015-10-05 DIAGNOSIS — Z125 Encounter for screening for malignant neoplasm of prostate: Secondary | ICD-10-CM

## 2015-10-05 DIAGNOSIS — I10 Essential (primary) hypertension: Secondary | ICD-10-CM

## 2015-10-12 ENCOUNTER — Other Ambulatory Visit: Payer: Medicare Other

## 2015-10-19 ENCOUNTER — Encounter: Payer: Medicare Other | Admitting: Primary Care

## 2015-11-10 ENCOUNTER — Encounter: Payer: Medicare Other | Admitting: Primary Care

## 2015-11-22 ENCOUNTER — Ambulatory Visit (INDEPENDENT_AMBULATORY_CARE_PROVIDER_SITE_OTHER): Payer: Federal, State, Local not specified - PPO | Admitting: Primary Care

## 2015-11-22 DIAGNOSIS — E785 Hyperlipidemia, unspecified: Secondary | ICD-10-CM

## 2015-11-22 DIAGNOSIS — R6882 Decreased libido: Secondary | ICD-10-CM

## 2015-11-22 DIAGNOSIS — Z0000 Encounter for general adult medical examination without abnormal findings: Secondary | ICD-10-CM | POA: Diagnosis not present

## 2015-11-22 DIAGNOSIS — Z23 Encounter for immunization: Secondary | ICD-10-CM | POA: Diagnosis not present

## 2015-11-22 DIAGNOSIS — J432 Centrilobular emphysema: Secondary | ICD-10-CM

## 2015-11-22 DIAGNOSIS — I10 Essential (primary) hypertension: Secondary | ICD-10-CM

## 2015-11-22 MED ORDER — ZOSTER VACCINE LIVE 19400 UNT/0.65ML ~~LOC~~ SOLR: 0.6500 mL | Freq: Once | SUBCUTANEOUS | Status: DC

## 2015-11-22 NOTE — Assessment & Plan Note (Signed)
Lipid panel pending. Continue atorvastatin 40 mg. 

## 2015-11-22 NOTE — Patient Instructions (Addendum)
You've been provided with a pneumonia vaccination today.   [facility]. Do not get this vaccination for at least 30 days after today.   It is important that you improve your diet. Please limit fast food, fried foods, desserts, sugary drinks, etc. Increase your consumption of fresh fruits and vegetables.  Continue your water consumption.   Continue walking daily, consider increasing time of walks to 45 minutes.   Schedule a lab only appointment tomorrow for your labs. I will call you once I receive the results.  Follow up in 6 months for re-evaluation.  It was a pleasure to see you today!

## 2015-11-22 NOTE — Assessment & Plan Note (Signed)
Present for years. Once trialed on viagra, didn't like the way it made him feel. Once provided with samples of Cialis, but son stole. He will call cardiology office for more samples as he cannot afford medication. Discount card provided today in case he's unable to obtain samples.

## 2015-11-22 NOTE — Progress Notes (Signed)
[name], male   DOB: Oct 20, 1945, 70 y.o.   MRN: [id number]  HPI: Bryan Calderon is a 70 year old male who presents today for medicare wellness visit.  Past Medical History  Diagnosis Date  . Alcohol abuse   . Hyperlipidemia   . Hypertension   . GERD (gastroesophageal reflux disease)   . Heart disease   . Coronary artery disease 2015    Current Outpatient Prescriptions  Medication Sig Dispense Refill  . atorvastatin (LIPITOR) 40 MG tablet Take 40 mg by mouth at bedtime.  8  . hydrochlorothiazide (HYDRODIURIL) 25 MG tablet TAKE 1 TABLET BY MOUTH DAILY FOR SWELLING AND BLOOD PRESSURE  9  . lisinopril (PRINIVIL,ZESTRIL) 10 MG tablet Take 10 mg by mouth daily.  6  . metoprolol (LOPRESSOR) 50 MG tablet Take 50 mg by mouth 2 (two) times daily.  2  . nitroGLYCERIN (NITROSTAT) 0.4 MG SL tablet TAKE 1 TABLET EVERY 5 MINS AS NEEDED AS DIRECTED  0  . vitamin E 400 UNIT capsule Take 400 Units by mouth. Taking 3 times a week     No current facility-administered medications for this visit.    No Known Allergies  Family History  Problem Relation Age of Onset  . Heart disease Maternal Grandmother   . Heart attack Maternal Grandmother     Social History   Social History  . Marital Status: Married    Spouse Name: N/A  . Number of Children: N/A  . Years of Education: N/A   Occupational History  . Not on file.   Social History Main Topics  . Smoking status: Current Every Day Smoker -- 1.00 packs/day for 35 years    Types: Cigarettes  . Smokeless tobacco: Not on file     Comment: about 3 cigarettes a day  . Alcohol Use: 0.0 oz/week    0 Standard drinks or equivalent per week     Comment: weekend socially  . Drug Use: No  . Sexual Activity: Not on file   Other Topics Concern  . Not on file   Social History Narrative   Married.   8 children   Retired. Looking for part time work.   Enjoys fishing, sports, spending time with family.    Hospitiliaztions:  None  Health Maintenance:    Flu: Completed in October 2016  Tetanus: Unsure, believes it's been within 10 years.  Pneumovax: Never received  Prevnar: Never received  Zostavax: Never received   Colonoscopy: Unsure, believes it was completed within 10 years. Completed in  [place], [place].  Eye Doctor: Completed several years ago. Denies difficulty in vision.  Dental Exam: Completes semi-annually.  PSA: Due and is pending.    Providers: Alma Friendly, PCP; Dentist, Dr. Rockey Situ, Cardiology.    I have personally reviewed and have noted: 1. The patient's medical and social history 2. Their use of alcohol, tobacco or illicit drugs 3. Their current medications and supplements 4. The patient's functional ability including ADL's, fall risks, home safety risks and  hearing or visual impairment. 5. Diet and physical activities 6. Evidence for depression or mood disorder  Subjective:   Review of Systems:   Constitutional: Denies fever, malaise, fatigue, headache or abrupt weight changes.  HEENT: Denies eye pain, eye redness, ear pain, ringing in the ears, wax buildup, runny nose, nasal congestion, bloody nose, or sore throat. Respiratory: Denies difficulty breathing, shortness of breath, cough or sputum production.   Cardiovascular: Denies chest pain, chest tightness, palpitations or swelling in  the hands or feet.  Gastrointestinal: Denies abdominal pain, bloating, constipation, diarrhea or blood in the stool.  GU: Denies urgency, frequency, pain with urination, burning sensation, blood in urine, odor or discharge. Musculoskeletal: Denies decrease in range of motion, difficulty with gait, muscle pain or joint pain and swelling.  Skin: Denies redness, rashes, lesions or ulcercations.  Neurological: Denies dizziness, difficulty with memory, difficulty with speech or problems with balance and coordination.   1) Decreased Libido: Present for the past several years. Decrease in sexual drive and  also with difficulty obtaining an erection. He was trialed on Viagra in the past but didn't like the way he felt. He had Cialis samples provided to him by his cardiologist but he never trialed as his son stole his samples. He is interested in trialing but cannot afford the medication. He will call his cardiologist for more samples.  No other specific complaints in a complete review of systems (except as listed in HPI above).  Objective:  PE:   There were no vitals taken for this visit. Wt Readings from Last 3 Encounters:  09/09/15 212 lb 4 oz (96.276 kg)  07/19/15 211 lb 12.8 oz (96.072 kg)    General: Appears their stated age, well developed, well nourished in NAD. Skin: Warm, dry and intact. No rashes, lesions or ulcerations noted. HEENT: Head: normal shape and size; Eyes: sclera white, no icterus, conjunctiva pink, PERRLA and EOMs intact; Ears: Tm's gray and intact, normal light reflex; Nose: mucosa pink and moist, septum midline; Throat/Mouth: Teeth present, mucosa pink and moist, no exudate, lesions or ulcerations noted.  Neck: Normal range of motion. Neck supple, trachea midline. No massses, lumps or thyromegaly present.  Cardiovascular: Normal rate and rhythm. S1,S2 noted.  No murmur, rubs or gallops noted. No JVD or BLE edema. No carotid bruits noted. Pulmonary/Chest: Normal effort and positive vesicular breath sounds. No respiratory distress. No wheezes, rales or ronchi noted.  Abdomen: Soft and nontender. Normal bowel sounds, no bruits noted. No distention or masses noted. Liver, spleen and kidneys non palpable. Musculoskeletal: Normal range of motion. No signs of joint swelling. No difficulty with gait.  Neurological: Alert and oriented. Cranial nerves II-XII intact. Coordination normal. +DTRs bilaterally. Psychiatric: Mood and affect normal. Behavior is normal. Judgment and thought content normal.   EKG:  BMET    Component Value Date/Time   NA 143 08/04/2014 0407   K 3.6  08/04/2014 0407   CL 111* 08/04/2014 0407   CO2 26 08/04/2014 0407   GLUCOSE 94 08/04/2014 0407   BUN 11 08/04/2014 0407   CREATININE 0.76 08/04/2014 0407   CALCIUM 7.6* 08/04/2014 0407   GFRNONAA >60 08/04/2014 0407   GFRAA >60 08/04/2014 0407    Lipid Panel     Component Value Date/Time   CHOL 122 08/04/2014 0407   TRIG 89 08/04/2014 0407   HDL 47 08/04/2014 0407   VLDL 18 08/04/2014 0407   LDLCALC 57 08/04/2014 0407    CBC    Component Value Date/Time   WBC 8.3 08/04/2014 0407   RBC 4.14* 08/04/2014 0407   HGB 11.4* 08/04/2014 0407   HCT 35.8* 08/04/2014 0407   PLT 201 08/04/2014 0407   MCV 87 08/04/2014 0407   MCH 27.7 08/04/2014 0407   MCHC 32.0 08/04/2014 0407   RDW 15.3* 08/04/2014 0407   LYMPHSABS 1.4 08/04/2014 0407   MONOABS 0.5 08/04/2014 0407   EOSABS 0.4 08/04/2014 0407   BASOSABS 0.1 08/04/2014 0407    Hgb A1C No  results found for: HGBA1C    Assessment and Plan:   Medicare Annual Wellness Visit:  Diet: Endorses a poor diet. Breakfast: Sausage, eggs, toast Lunch: Skips, sandwich Dinner: Home cooked meals (salads, fried meats, baked meats, vegetables, bread) Desserts: 3-4 times weekly Beverages: Water, limited sodas (2-3 sodas weekly)  Physical activity: He walks everyday for 20-30 minutes daily walking his dog. Depression/mood screen: Negative Hearing: Intact to whispered voice Visual acuity: Grossly normal, performs annual eye exam  ADLs: Capable Fall risk: None Home safety: Good Cognitive evaluation: Intact to orientation, naming, recall and repetition EOL planning: Has advanced directives, Full Code.  Preventative Medicine: Never completed pneumonia or shingles vaccination. Prevnar 13 provided today. RX printed for Zostavax, he will call insurance company regarding coverage. Discussed the importance of a healthy diet and regular exercise in order for weight loss and to reduce risk of other medical diseases. He is to increase his walking  time to 45 minutes daily. Negative depression screen. Labs currently pending.  Next appointment: Follow up in 6 months.

## 2015-11-22 NOTE — Assessment & Plan Note (Signed)
Stable on current regimen. Continue. ?

## 2015-11-22 NOTE — Addendum Note (Signed)
Addended by: Jacqualin Combes on: 11/22/2015 02:14 PM   Modules accepted: Orders, SmartSet

## 2015-11-22 NOTE — Assessment & Plan Note (Signed)
Discussed importance of smoking cessation. He is not interested in quitting.

## 2015-11-22 NOTE — Progress Notes (Signed)
Pre visit review using our clinic review tool, if applicable. No additional management support is needed unless otherwise documented below in the visit note. 

## 2015-11-22 NOTE — Assessment & Plan Note (Addendum)
Tdap and Flu, UTD. Prevnar 13 provided today. RX for Zostavax provided to fill in 30 days (he understands 30 days). Exam unremarkable. Discussed the importance of a healthy diet and regular exercise in order for weight loss and to reduce risk of other medical diseases. Labs pending.  I have personally reviewed and have noted: 1. The patient's medical and social history 2. Their use of alcohol, tobacco or illicit drugs 3. Their current medications and supplements 4. The patient's functional ability including ADL's, fall  risks, home safety risks and  hearing or visual  impairment. 5. Diet and physical activities 6. Evidence for depression or mood disorder  Follow up in 1 year for repeat MWV.

## 2015-11-23 ENCOUNTER — Other Ambulatory Visit (INDEPENDENT_AMBULATORY_CARE_PROVIDER_SITE_OTHER): Payer: Federal, State, Local not specified - PPO

## 2015-11-23 ENCOUNTER — Encounter: Payer: Self-pay | Admitting: *Deleted

## 2015-11-23 DIAGNOSIS — Z125 Encounter for screening for malignant neoplasm of prostate: Secondary | ICD-10-CM

## 2015-11-23 DIAGNOSIS — E785 Hyperlipidemia, unspecified: Secondary | ICD-10-CM | POA: Diagnosis not present

## 2015-11-23 DIAGNOSIS — I10 Essential (primary) hypertension: Secondary | ICD-10-CM | POA: Diagnosis not present

## 2015-11-23 LAB — LIPID PANEL
CHOLESTEROL: 166 mg/dL (ref 0–200)
HDL: 58.2 mg/dL (ref >39.00)
LDL Cholesterol: 76 mg/dL (ref 0–99)
NONHDL: 107.86
Total CHOL/HDL Ratio: 3
Triglycerides: 158 mg/dL — ABNORMAL HIGH (ref 0.0–149.0)
VLDL: 31.6 mg/dL (ref 0.0–40.0)

## 2015-11-23 LAB — COMPREHENSIVE METABOLIC PANEL
ALK PHOS: 71 U/L (ref 39–117)
ALT: 13 U/L (ref 0–53)
AST: 18 U/L (ref 0–37)
Albumin: 4.3 g/dL (ref 3.5–5.2)
BILIRUBIN TOTAL: 0.3 mg/dL (ref 0.2–1.2)
BUN: 15 mg/dL (ref 6–23)
CALCIUM: 9.2 mg/dL (ref 8.4–10.5)
CO2: 27 meq/L (ref 19–32)
Chloride: 106 mEq/L (ref 96–112)
Creatinine, Ser: 0.78 mg/dL (ref 0.40–1.50)
GFR: 126.89 mL/min (ref >60.00)
GLUCOSE: 86 mg/dL (ref 70–99)
POTASSIUM: 3.9 meq/L (ref 3.5–5.1)
Sodium: 141 mEq/L (ref 135–145)
TOTAL PROTEIN: 7.2 g/dL (ref 6.0–8.3)

## 2015-11-23 LAB — CBC
HCT: 39.2 % (ref 39.0–52.0)
HEMOGLOBIN: 12.9 g/dL — AB (ref 13.0–17.0)
MCHC: 33 g/dL (ref 30.0–36.0)
MCV: 82.7 fl (ref 78.0–100.0)
PLATELETS: 262 10*3/uL (ref 150.0–400.0)
RBC: 4.74 Mil/uL (ref 4.22–5.81)
RDW: 16.8 % — ABNORMAL HIGH (ref 11.5–15.5)
WBC: 9.7 10*3/uL (ref 4.0–10.5)

## 2015-11-23 LAB — PSA, MEDICARE: PSA: 1.07 ng/mL (ref 0.10–4.00)

## 2015-11-23 LAB — HEMOGLOBIN A1C: Hgb A1c MFr Bld: 5.7 % (ref 4.6–6.5)

## 2016-05-22 ENCOUNTER — Ambulatory Visit: Payer: Federal, State, Local not specified - PPO | Admitting: Primary Care

## 2016-05-22 ENCOUNTER — Ambulatory Visit (INDEPENDENT_AMBULATORY_CARE_PROVIDER_SITE_OTHER): Payer: Federal, State, Local not specified - PPO | Admitting: Primary Care

## 2016-05-22 ENCOUNTER — Encounter: Payer: Self-pay | Admitting: Primary Care

## 2016-05-22 DIAGNOSIS — Z720 Tobacco use: Secondary | ICD-10-CM

## 2016-05-22 DIAGNOSIS — F17200 Nicotine dependence, unspecified, uncomplicated: Secondary | ICD-10-CM

## 2016-05-22 DIAGNOSIS — Z23 Encounter for immunization: Secondary | ICD-10-CM

## 2016-05-22 DIAGNOSIS — I2510 Atherosclerotic heart disease of native coronary artery without angina pectoris: Secondary | ICD-10-CM

## 2016-05-22 DIAGNOSIS — I10 Essential (primary) hypertension: Secondary | ICD-10-CM | POA: Diagnosis not present

## 2016-05-22 DIAGNOSIS — J432 Centrilobular emphysema: Secondary | ICD-10-CM

## 2016-05-22 MED ORDER — ZOSTER VACCINE LIVE 19400 UNT/0.65ML ~~LOC~~ SUSR: 0.6500 mL | Freq: Once | SUBCUTANEOUS | 0 refills | Status: AC

## 2016-05-22 NOTE — Assessment & Plan Note (Signed)
Overall stable, denies chest pain. Suspect exertional shortness of breath due to COPD and tobacco use. No lower extremity edema. Follows with cardiology annually.

## 2016-05-22 NOTE — Assessment & Plan Note (Signed)
Assessment today stable. Does continue to smoke 1 pack of cigarettes weekly and is not ready to quit. Exertional shortness of breath at times. Exam today unremarkable.

## 2016-05-22 NOTE — Assessment & Plan Note (Signed)
Overall stable in the office today. Given history of CAD with stent placement of pressure may need to run less than 140/90. He will start monitoring his blood pressure once to twice monthly and notify myself of readings greater than 140/90 consistently. Continue HCTZ, lisinopril, metoprolol. Also discussed to start taking metoprolol twice daily as prescribed rather than all at once.

## 2016-05-22 NOTE — Assessment & Plan Note (Signed)
Smokes one pack per week and is not ready to quit. Emphasized the importance of tobacco cessation especially given heart disease and COPD.

## 2016-05-22 NOTE — Patient Instructions (Signed)
Remember to take the Metoprolol 50 mg tablets twice daily. Take 1 tablet in the morning and 1 tablet in the evening.  Take the shingles shot prescription to the pharmacy and they will administer.  Reduce consumption of fried foods. Continue exercising.  Monitor your blood pressure 2-3 times monthly and report readings above 140/90.  Follow up in 6 months for your annual wellness exam.  It was a pleasure to see you today!  Food Choices to Lower Your Triglycerides Triglycerides are a type of fat in your blood. High levels of triglycerides can increase the risk of heart disease and stroke. If your triglyceride levels are high, the foods you eat and your eating habits are very important. Choosing the right foods can help lower your triglycerides.  WHAT GENERAL GUIDELINES DO I NEED TO FOLLOW?  Lose weight if you are overweight.   Limit or avoid alcohol.   Fill one half of your plate with vegetables and green salads.   Limit fruit to two servings a day. Choose fruit instead of juice.   Make one fourth of your plate whole grains. Look for the word "whole" as the first word in the ingredient list.  Fill one fourth of your plate with lean protein foods.  Enjoy fatty fish (such as salmon, mackerel, sardines, and tuna) three times a week.   Choose healthy fats.   Limit foods high in starch and sugar.  Eat more home-cooked food and less restaurant, buffet, and fast food.  Limit fried foods.  Cook foods using methods other than frying.  Limit saturated fats.  Check ingredient lists to avoid foods with partially hydrogenated oils (trans fats) in them. WHAT FOODS CAN I EAT?  Grains Whole grains, such as whole wheat or whole grain breads, crackers, cereals, and pasta. Unsweetened oatmeal, bulgur, barley, quinoa, or brown rice. Corn or whole wheat flour tortillas.  Vegetables Fresh or frozen vegetables (raw, steamed, roasted, or grilled). Green salads. Fruits All fresh, canned  (in natural juice), or frozen fruits. Meat and Other Protein Products Ground beef (85% or leaner), grass-fed beef, or beef trimmed of fat. Skinless chicken or [place]. Ground chicken or [place]. Pork trimmed of fat. All fish and seafood. Eggs. Dried beans, peas, or lentils. Unsalted nuts or seeds. Unsalted canned or dry beans. Dairy Low-fat dairy products, such as skim or 1% milk, 2% or reduced-fat cheeses, low-fat ricotta or cottage cheese, or plain low-fat yogurt. Fats and Oils Tub margarines without trans fats. Light or reduced-fat mayonnaise and salad dressings. Avocado. Safflower, olive, or canola oils. Natural peanut or almond butter. The items listed above may not be a complete list of recommended foods or beverages. Contact your dietitian for more options. WHAT FOODS ARE NOT RECOMMENDED?  Grains White bread. White pasta. White rice. Cornbread. Bagels, pastries, and croissants. Crackers that contain trans fat. Vegetables White potatoes. Corn. Creamed or fried vegetables. Vegetables in a cheese sauce. Fruits Dried fruits. Canned fruit in light or heavy syrup. Fruit juice. Meat and Other Protein Products Fatty cuts of meat. Ribs, chicken wings, bacon, sausage, bologna, salami, chitterlings, fatback, hot dogs, bratwurst, and packaged luncheon meats. Dairy Whole or 2% milk, cream, half-and-half, and cream cheese. Whole-fat or sweetened yogurt. Full-fat cheeses. Nondairy creamers and whipped toppings. Processed cheese, cheese spreads, or cheese curds. Sweets and Desserts Corn syrup, sugars, honey, and molasses. Candy. Jam and jelly. Syrup. Sweetened cereals. Cookies, pies, cakes, donuts, muffins, and ice cream. Fats and Oils Butter, stick margarine, lard, shortening, ghee, or bacon fat.  Coconut, palm kernel, or palm oils. Beverages Alcohol. Sweetened drinks (such as sodas, lemonade, and fruit drinks or punches). The items listed above may not be a complete list of foods and beverages to  avoid. Contact your dietitian for more information.   This information is not intended to replace advice given to you by your health care provider. Make sure you discuss any questions you have with your health care provider.   Document Released: 07/13/2004 Document Revised: 10/16/2014 Document Reviewed: 07/30/2013 Elsevier Interactive Patient Education [employer].

## 2016-05-22 NOTE — Progress Notes (Signed)
Pre visit review using our clinic review tool, if applicable. No additional management support is needed unless otherwise documented below in the visit note. 

## 2016-05-22 NOTE — Progress Notes (Signed)
Subjective:    [name], male    DOB: 1946-05-16, 70 y.o.   MRN: [id number]  HPI  Bryan Calderon is a 70 year old male who presents today for follow up.  1) Essential Hypertension: Currently managed on HCTZ 25 mg, lisinopril 10 mg, and metoprolol 50 mg (taking 100 mg once daily). His BP in the office today is 144/84, HR at 50. He does not check his BP at home but he does have a machine for which he will start using.  His diet currently consists of: Breakfast: Boiled eggs, fruit, sausage and eggs, toast, vegetables Lunch: Fruit, sandwich, vegetables Dinner: Sandwich, fried fish/pork chops, vegetables Snacks: Fruit, candy Dessert: 2-3 times weekly Beverages: Milk, water, occasional soda  Exercise: He is walking 1 mile every other day, sometimes 2 miles.   2) CAD: Currently managed on Metoprolol 50 mg and atorvastatin. Follows with cardiology annually. Denies chest pain, dizziness. He does experience exertional shortness of breath at times, current smoker.  3) Tobacco abuse/COPD: He continues to smoke cigarettes and is smoking 1 pack per week. He does experience shortness of breath with exertional activities such as mowing the yard and walking his dog long distances. Denies wheezing. He does experience an occasional cough. He is not ready to quit.  Review of Systems  Constitutional: Negative for unexpected weight change.  Respiratory: Negative for cough and shortness of breath.   Cardiovascular: Negative for chest pain.  Neurological: Negative for dizziness, numbness and headaches.       Past Medical History:  Diagnosis Date  . Alcohol abuse   . Coronary artery disease 2015  . GERD (gastroesophageal reflux disease)   . Heart disease   . Hyperlipidemia   . Hypertension      Social History   Social History  . Marital status: Married    Spouse name: N/A  . Number of children: N/A  . Years of education: N/A   Occupational History  . Not on file.    Social History Main Topics  . Smoking status: Current Some Day Smoker    Packs/day: 1.00    Years: 35.00    Types: Cigarettes  . Smokeless tobacco: Not on file     Comment: about 3 cigarettes a day  . Alcohol use 0.0 oz/week     Comment: weekend socially  . Drug use: No  . Sexual activity: Not on file   Other Topics Concern  . Not on file   Social History Narrative   Married.   8 children   Retired. Looking for part time work.   Enjoys fishing, sports, spending time with family.    Past Surgical History:  Procedure Laterality Date  . APPENDECTOMY    . CARDIAC CATHETERIZATION  08/03/2014  . CARDIAC CATHETERIZATION  [place], [place]   . CAROTID STENT  2015 and 2004  . CORONARY ANGIOPLASTY WITH STENT PLACEMENT  08/03/2014  . CORONARY ANGIOPLASTY WITH STENT PLACEMENT  2005   [place], [place]   . SHOULDER SURGERY Bilateral    Rotator Cuff Repair    Family History  Problem Relation Age of Onset  . Heart disease Maternal Grandmother   . Heart attack Maternal Grandmother     No Known Allergies  Current Outpatient Prescriptions on File Prior to Visit  Medication Sig Dispense Refill  . hydrochlorothiazide (HYDRODIURIL) 25 MG tablet TAKE 1 TABLET BY MOUTH DAILY FOR SWELLING AND BLOOD PRESSURE  9  . lisinopril (PRINIVIL,ZESTRIL) 10 MG tablet  Take 10 mg by mouth daily.  6  . metoprolol (LOPRESSOR) 50 MG tablet Take 100 mg by mouth daily.   2  . vitamin E 400 UNIT capsule Take 400 Units by mouth. Taking 3 times a week    . atorvastatin (LIPITOR) 40 MG tablet Take 40 mg by mouth at bedtime.  8  . nitroGLYCERIN (NITROSTAT) 0.4 MG SL tablet TAKE 1 TABLET EVERY 5 MINS AS NEEDED AS DIRECTED  0   No current facility-administered medications on file prior to visit.     BP (!) 144/84   Pulse (!) 50   Temp 98.1 F (36.7 C) (Oral)   Ht 5\' 9"  (1.753 m)   Wt 213 lb 12.8 oz (97 kg)   SpO2 98%   BMI 31.57 kg/m    Objective:   Physical Exam  Constitutional: He appears  well-nourished.  Neck: Neck supple.  Cardiovascular: Normal rate and regular rhythm.   Pulmonary/Chest: Effort normal and breath sounds normal.  Skin: Skin is warm and dry.          Assessment & Plan:

## 2016-05-31 ENCOUNTER — Other Ambulatory Visit: Payer: Self-pay | Admitting: Primary Care

## 2016-05-31 DIAGNOSIS — I10 Essential (primary) hypertension: Secondary | ICD-10-CM

## 2016-05-31 NOTE — Telephone Encounter (Signed)
Ok to refill? Electronically refill request for metoprolol (LOPRESSOR) 50 MG tablet.  Medication have not been prescribed by Bryan Calderon. Last seen on 05/22/2016. Next CPE on 11/24/2016.

## 2016-06-08 ENCOUNTER — Telehealth: Payer: Self-pay | Admitting: Primary Care

## 2016-06-08 DIAGNOSIS — Z23 Encounter for immunization: Secondary | ICD-10-CM

## 2016-06-08 MED ORDER — ZOSTER VACCINE LIVE 19400 UNT/0.65ML ~~LOC~~ SUSR: 0.6500 mL | Freq: Once | SUBCUTANEOUS | 0 refills | Status: AC

## 2016-06-08 NOTE — Telephone Encounter (Signed)
Pt has lost his rx for the shingles vaccine.  Can you write another one?  cb number is [phone]

## 2016-06-08 NOTE — Telephone Encounter (Signed)
Called and notified patient that Rx for shingles vaccine. Left Rx in the front office.

## 2016-06-08 NOTE — Telephone Encounter (Signed)
No problem. Prescription placed in [facility] in box and is ready for pickup at his convenience.

## 2016-07-19 ENCOUNTER — Telehealth: Payer: Self-pay | Admitting: Cardiovascular Disease

## 2016-07-19 NOTE — Telephone Encounter (Signed)
3 attempts to schedule fu from recall list.  Deleting recall.  °

## 2016-11-08 ENCOUNTER — Other Ambulatory Visit: Payer: Self-pay | Admitting: Primary Care

## 2016-11-08 DIAGNOSIS — R7303 Prediabetes: Secondary | ICD-10-CM

## 2016-11-08 DIAGNOSIS — E785 Hyperlipidemia, unspecified: Secondary | ICD-10-CM

## 2016-11-08 DIAGNOSIS — Z1159 Encounter for screening for other viral diseases: Secondary | ICD-10-CM

## 2016-11-08 DIAGNOSIS — D649 Anemia, unspecified: Secondary | ICD-10-CM

## 2016-11-08 DIAGNOSIS — I10 Essential (primary) hypertension: Secondary | ICD-10-CM

## 2016-11-22 ENCOUNTER — Other Ambulatory Visit: Payer: Federal, State, Local not specified - PPO

## 2016-11-24 ENCOUNTER — Encounter: Payer: Federal, State, Local not specified - PPO | Admitting: Primary Care

## 2016-11-29 ENCOUNTER — Other Ambulatory Visit: Payer: Federal, State, Local not specified - PPO

## 2016-12-01 ENCOUNTER — Encounter: Payer: Federal, State, Local not specified - PPO | Admitting: Primary Care

## 2016-12-11 ENCOUNTER — Encounter (INDEPENDENT_AMBULATORY_CARE_PROVIDER_SITE_OTHER): Payer: Self-pay

## 2016-12-11 ENCOUNTER — Other Ambulatory Visit (INDEPENDENT_AMBULATORY_CARE_PROVIDER_SITE_OTHER): Payer: Federal, State, Local not specified - PPO

## 2016-12-11 DIAGNOSIS — E785 Hyperlipidemia, unspecified: Secondary | ICD-10-CM

## 2016-12-11 DIAGNOSIS — I10 Essential (primary) hypertension: Secondary | ICD-10-CM | POA: Diagnosis not present

## 2016-12-11 DIAGNOSIS — R7303 Prediabetes: Secondary | ICD-10-CM | POA: Diagnosis not present

## 2016-12-11 DIAGNOSIS — D649 Anemia, unspecified: Secondary | ICD-10-CM | POA: Diagnosis not present

## 2016-12-11 DIAGNOSIS — Z1159 Encounter for screening for other viral diseases: Secondary | ICD-10-CM

## 2016-12-11 LAB — CBC
HCT: 40.5 % (ref 39.0–52.0)
Hemoglobin: 13.1 g/dL (ref 13.0–17.0)
MCHC: 32.4 g/dL (ref 30.0–36.0)
MCV: 80.3 fl (ref 78.0–100.0)
RBC: 5.05 Mil/uL (ref 4.22–5.81)
RDW: 15.6 % — ABNORMAL HIGH (ref 11.5–15.5)
WBC: 8.9 10*3/uL (ref 4.0–10.5)

## 2016-12-11 LAB — COMPREHENSIVE METABOLIC PANEL
ALK PHOS: 62 U/L (ref 39–117)
ALT: 11 U/L (ref 0–53)
AST: 16 U/L (ref 0–37)
Albumin: 4.1 g/dL (ref 3.5–5.2)
BILIRUBIN TOTAL: 0.4 mg/dL (ref 0.2–1.2)
BUN: 22 mg/dL (ref 6–23)
CALCIUM: 9.3 mg/dL (ref 8.4–10.5)
CO2: 27 meq/L (ref 19–32)
CREATININE: 0.89 mg/dL (ref 0.40–1.50)
Chloride: 106 mEq/L (ref 96–112)
GFR: 108.63 mL/min (ref >60.00)
GLUCOSE: 97 mg/dL (ref 70–99)
Potassium: 3.9 mEq/L (ref 3.5–5.1)
Sodium: 138 mEq/L (ref 135–145)
TOTAL PROTEIN: 7.3 g/dL (ref 6.0–8.3)

## 2016-12-11 LAB — LIPID PANEL
CHOL/HDL RATIO: 5
CHOLESTEROL: 216 mg/dL — AB (ref 0–200)
HDL: 42.7 mg/dL (ref >39.00)
LDL CALC: 156 mg/dL — AB (ref 0–99)
NonHDL: 172.83
TRIGLYCERIDES: 85 mg/dL (ref 0.0–149.0)
VLDL: 17 mg/dL (ref 0.0–40.0)

## 2016-12-11 LAB — HEMOGLOBIN A1C: Hgb A1c MFr Bld: 5.8 % (ref 4.6–6.5)

## 2016-12-12 LAB — HEPATITIS C ANTIBODY: HCV AB: NEGATIVE

## 2016-12-13 ENCOUNTER — Encounter: Payer: Self-pay | Admitting: Primary Care

## 2016-12-13 ENCOUNTER — Ambulatory Visit (INDEPENDENT_AMBULATORY_CARE_PROVIDER_SITE_OTHER): Payer: Federal, State, Local not specified - PPO | Admitting: Primary Care

## 2016-12-13 VITALS — BP 146/94 | HR 74 | Temp 97.6°F | Ht 69.0 in | Wt 214.1 lb

## 2016-12-13 DIAGNOSIS — I10 Essential (primary) hypertension: Secondary | ICD-10-CM

## 2016-12-13 DIAGNOSIS — Z0000 Encounter for general adult medical examination without abnormal findings: Secondary | ICD-10-CM | POA: Diagnosis not present

## 2016-12-13 DIAGNOSIS — I2510 Atherosclerotic heart disease of native coronary artery without angina pectoris: Secondary | ICD-10-CM

## 2016-12-13 DIAGNOSIS — Z23 Encounter for immunization: Secondary | ICD-10-CM

## 2016-12-13 DIAGNOSIS — J432 Centrilobular emphysema: Secondary | ICD-10-CM | POA: Diagnosis not present

## 2016-12-13 DIAGNOSIS — E785 Hyperlipidemia, unspecified: Secondary | ICD-10-CM

## 2016-12-13 MED ORDER — ATORVASTATIN CALCIUM 40 MG PO TABS: 40.0000 mg | ORAL_TABLET | Freq: Every day | ORAL | 3 refills | Status: DC

## 2016-12-13 MED ORDER — HYDROCHLOROTHIAZIDE 25 MG PO TABS: ORAL_TABLET | ORAL | 3 refills | Status: DC

## 2016-12-13 MED ORDER — LISINOPRIL 10 MG PO TABS: 10.0000 mg | ORAL_TABLET | Freq: Every day | ORAL | 3 refills | Status: DC

## 2016-12-13 NOTE — Assessment & Plan Note (Signed)
Stable, no exertional SOB. Clear lungs.

## 2016-12-13 NOTE — Patient Instructions (Signed)
Your cholesterol is too high. Restart atorvastatin 40 mg once daily for cholesterol and heart protection.  Your blood pressure is too high, please resume your blood pressure medications.  Prediabetes: Your blood sugar levels are too high. Prediabetes means that you do not have diabetes, but you are at risk for development of diabetes if you do not work to reduce your blood sugar levels. Decrease consumption of fast food, fried food, processed carbohydrates (chips, cookies, etc), sugary drinks, sweets. Increase consumption of fresh fruits and vegetables, whole grains, water. Exercise will also lower these levels.   It's important to improve your diet by reducing consumption of fast food, fried food, processed snack foods, sugary drinks. Increase consumption of fresh vegetables and fruits, whole grains, water.  Ensure you are drinking 64 ounces of water daily.  Start exercising. You should be getting 150 minutes of moderate intensity exercise weekly.  Schedule a lab only appointment in 6 months to recheck your sugar levels and cholesterol. Come fasting to this appointment.  Follow up in 1 year for your annual exam, or sooner if needed.  It was a pleasure to see you today!

## 2016-12-13 NOTE — Progress Notes (Signed)
Pre visit review using our clinic review tool, if applicable. No additional management support is needed unless otherwise documented below in the visit note. 

## 2016-12-13 NOTE — Assessment & Plan Note (Signed)
Not taking Lipitor. Discussed the importance of this for prevention of future blockages. He verbalized understanding, refills sent.

## 2016-12-13 NOTE — Progress Notes (Signed)
[name], male   DOB: 09-27-1946, 71 y.o.   MRN: [id number]  HPI: Bryan Calderon is a 71 year old male who presents today for AWV.  Past Medical History:  Diagnosis Date  . Alcohol abuse   . Coronary artery disease 2015  . GERD (gastroesophageal reflux disease)   . Heart disease   . Hyperlipidemia   . Hypertension     Current Outpatient Prescriptions  Medication Sig Dispense Refill  . atorvastatin (LIPITOR) 40 MG tablet Take 40 mg by mouth at bedtime.  8  . hydrochlorothiazide (HYDRODIURIL) 25 MG tablet TAKE 1 TABLET BY MOUTH DAILY FOR SWELLING AND BLOOD PRESSURE  9  . lisinopril (PRINIVIL,ZESTRIL) 10 MG tablet Take 10 mg by mouth daily.  6  . metoprolol (LOPRESSOR) 50 MG tablet TAKE 1 TABLET BY MOUTH TWICE A DAY 180 tablet 1  . nitroGLYCERIN (NITROSTAT) 0.4 MG SL tablet TAKE 1 TABLET EVERY 5 MINS AS NEEDED AS DIRECTED  0  . vitamin E 400 UNIT capsule Take 400 Units by mouth. Taking 3 times a week     No current facility-administered medications for this visit.     No Known Allergies  Family History  Problem Relation Age of Onset  . Heart disease Maternal Grandmother   . Heart attack Maternal Grandmother     Social History   Social History  . Marital status: Married    Spouse name: N/A  . Number of children: N/A  . Years of education: N/A   Occupational History  . Not on file.   Social History Main Topics  . Smoking status: Current Some Day Smoker    Packs/day: 1.00    Years: 35.00    Types: Cigarettes  . Smokeless tobacco: Never Used     Comment: about 3 cigarettes a day  . Alcohol use 0.0 oz/week     Comment: weekend socially  . Drug use: No  . Sexual activity: Not on file   Other Topics Concern  . Not on file   Social History Narrative   Married.   8 children   Retired. Looking for part time work.   Enjoys fishing, sports, spending time with family.    Hospitiliaztions: None  Health Maintenance:    Flu: Completed this  season.  Tetanus: Unsure  Pneumovax: Due  Prevnar: Completed in 2017  Zostavax: Completed in 2017.  Colonoscopy: UTD, completed within 10 years.  Eye Doctor: Completed several years ago.   Dental Exam: Completes semi-annually  PSA: Completed in 2017, negative  Hep C Screening: Negative    Providers: Alma Friendly, PCP; Dr. Rockey Situ, Cardiology   I have personally reviewed and have noted: 1. The patient's medical and social history 2. Their use of alcohol, tobacco or illicit drugs 3. Their current medications and supplements 4. The patient's functional ability including ADL's, fall risks, home safety risks  and hearing or visual impairment. 5. Diet and physical activities 6. Evidence for depression or mood disorder  Subjective:   Review of Systems:   Constitutional: Denies fever, malaise, fatigue, headache or abrupt weight changes.  HEENT: Denies eye pain, eye redness, ear pain, ringing in the ears, wax buildup, runny nose, nasal congestion, bloody nose, or sore throat. Respiratory: Denies difficulty breathing, shortness of breath, cough or sputum production.   Cardiovascular: Denies chest pain, chest tightness, palpitations or swelling in the hands or feet.  Gastrointestinal: Denies abdominal pain, bloating, constipation, diarrhea or blood in the stool.  GU: Denies urgency, frequency, pain  with urination, burning sensation, blood in urine, odor or discharge. Musculoskeletal: Denies decrease in range of motion, difficulty with gait, muscle pain or joint pain and swelling.  Skin: Denies redness, rashes, lesions or ulcercations.  Neurological: Denies dizziness, difficulty with memory, difficulty with speech or problems with balance and coordination.  Psychiatric: Denies concerns for anxiety or depression in general. Does feel down occasionally, thinks this is more boredom.   No other specific complaints in a complete review of systems (except as listed in HPI  above).  Objective:  PE:   BP (!) 146/94   Pulse 74   Temp 97.6 F (36.4 C) (Oral)   Ht 5\' 9"  (1.753 m)   Wt 214 lb 1.9 oz (97.1 kg)   SpO2 97%   BMI 31.62 kg/m  Wt Readings from Last 3 Encounters:  12/13/16 214 lb 1.9 oz (97.1 kg)  05/22/16 213 lb 12.8 oz (97 kg)  09/09/15 212 lb 4 oz (96.3 kg)    General: Appears their stated age, well developed, well nourished in NAD. Skin: Warm, dry and intact. No rashes, lesions or ulcerations noted. HEENT: Head: normal shape and size; Eyes: sclera white, no icterus, conjunctiva pink, PERRLA and EOMs intact; Ears: Tm's gray and intact, normal light reflex; Nose: mucosa pink and moist, septum midline; Throat/Mouth: Teeth present, mucosa pink and moist, no exudate, lesions or ulcerations noted.  Neck: Normal range of motion. Neck supple, trachea midline. No massses, lumps or thyromegaly present.  Cardiovascular: Normal rate and rhythm. S1,S2 noted.  No murmur, rubs or gallops noted. No JVD or BLE edema. No carotid bruits noted. Pulmonary/Chest: Normal effort and positive vesicular breath sounds. No respiratory distress. No wheezes, rales or ronchi noted.  Abdomen: Soft and nontender. Normal bowel sounds, no bruits noted. No distention or masses noted. Liver, spleen and kidneys non palpable. Musculoskeletal: Normal range of motion. No signs of joint swelling. No difficulty with gait.  Neurological: Alert and oriented. Cranial nerves II-XII intact. Coordination normal. +DTRs bilaterally. Psychiatric: Mood and affect normal. Behavior is normal. Judgment and thought content normal.     BMET    Component Value Date/Time   NA 138 12/11/2016 1034   NA 143 08/04/2014 0407   K 3.9 12/11/2016 1034   K 3.6 08/04/2014 0407   CL 106 12/11/2016 1034   CL 111 (H) 08/04/2014 0407   CO2 27 12/11/2016 1034   CO2 26 08/04/2014 0407   GLUCOSE 97 12/11/2016 1034   GLUCOSE 94 08/04/2014 0407   BUN 22 12/11/2016 1034   BUN 11 08/04/2014 0407   CREATININE  0.89 12/11/2016 1034   CREATININE 0.76 08/04/2014 0407   CALCIUM 9.3 12/11/2016 1034   CALCIUM 7.6 (L) 08/04/2014 0407   GFRNONAA >60 08/04/2014 0407   GFRAA >60 08/04/2014 0407    Lipid Panel     Component Value Date/Time   CHOL 216 (H) 12/11/2016 1034   CHOL 122 08/04/2014 0407   TRIG 85.0 12/11/2016 1034   TRIG 89 08/04/2014 0407   HDL 42.70 12/11/2016 1034   HDL 47 08/04/2014 0407   CHOLHDL 5 12/11/2016 1034   VLDL 17.0 12/11/2016 1034   VLDL 18 08/04/2014 0407   LDLCALC 156 (H) 12/11/2016 1034   LDLCALC 57 08/04/2014 0407    CBC    Component Value Date/Time   WBC 8.9 12/11/2016 1034   RBC 5.05 12/11/2016 1034   HGB 13.1 12/11/2016 1034   HGB 11.4 (L) 08/04/2014 0407   HCT 40.5 12/11/2016 1034  HCT 35.8 (L) 08/04/2014 0407   PLT 234.0 Repeated and verified X2. 12/11/2016 1034   PLT 201 08/04/2014 0407   MCV 80.3 12/11/2016 1034   MCV 87 08/04/2014 0407   MCH 27.7 08/04/2014 0407   MCHC 32.4 12/11/2016 1034   RDW 15.6 (H) 12/11/2016 1034   RDW 15.3 (H) 08/04/2014 0407   LYMPHSABS 1.4 08/04/2014 0407   MONOABS 0.5 08/04/2014 0407   EOSABS 0.4 08/04/2014 0407   BASOSABS 0.1 08/04/2014 0407    Hgb A1C Lab Results  Component Value Date   HGBA1C 5.8 12/11/2016      Assessment and Plan:   Medicare Annual Wellness Visit:  Diet: He endorses poor diet. Breakfast: Grits, eggs, sausage, fruit Lunch: Fruit, sandwich Dinner: Chicken, fish, beef, pork, vegetables, bread Snacks: Occasionally, fruit, candy Desserts: Candy, occasionally  Beverages: Water, milk, soda, occasionally sweet tea Physical activity: He does not currently exercise Depression/mood screen: Negative, continue to monitor. Hearing: Intact to whispered voice Visual acuity: Grossly normal, performs annual eye exam  ADLs: Capable Fall risk: None Home safety: Good Cognitive evaluation: Intact to orientation, naming, recall and repetition EOL planning: Adv directives completed. HCPA is wife,  Ocie Doyne.   Preventative Medicine: Immunizations UTD, Pneumovax provided today. PSA and Colonoscopy UTD. Hep C screen negative. Discussed to get annual eye exams. Discussed the importance of a healthy diet and regular exercise in order for weight loss, and to reduce the risk of other medical diseases. Exam unremarkable. Labs with hyperlipidemia, has not taken Lipitor in months, will restart. Has not taken BP meds today, refills sent. All recommendations provided at end of visit.   Next appointment:  Follow up in 1 year for annual exam

## 2016-12-13 NOTE — Assessment & Plan Note (Signed)
Repeat lipids above goal, has not had Lipitor in months. Refills sent, discussed importance of compliance given CAD and HTN.

## 2016-12-13 NOTE — Assessment & Plan Note (Signed)
Immunizations UTD, Pneumovax provided today. PSA and Colonoscopy UTD. Hep C screen negative. Discussed to get annual eye exams. Discussed the importance of a healthy diet and regular exercise in order for weight loss, and to reduce the risk of other medical diseases. Exam unremarkable. Labs with hyperlipidemia, has not taken Lipitor in months, will restart. Has not taken BP meds today, refills sent. All recommendations provided at end of visit.  I have personally reviewed and have noted: 1. The patient's medical and social history 2. Their use of alcohol, tobacco or illicit drugs 3. Their current medications and supplements 4. The patient's functional ability including ADL's, fall risks, home safety risks  and hearing or visual impairment. 5. Diet and physical activities 6. Evidence for depression or mood disorder

## 2016-12-13 NOTE — Assessment & Plan Note (Signed)
Has not had meds today, refills sent. BMP unremarkable. Will call him in 3 weeks to check on BP home readings.

## 2017-01-03 ENCOUNTER — Telehealth: Payer: Self-pay | Admitting: Primary Care

## 2017-01-03 NOTE — Telephone Encounter (Signed)
Noted  

## 2017-01-03 NOTE — Telephone Encounter (Signed)
Spoke to pt who states "it is lower" but is unaware of the exact numbers. Pt states he will obtain actual readings when his wife arrives back home and will contact office back

## 2017-01-03 NOTE — Telephone Encounter (Signed)
-----   Message from Pleas Koch, NP sent at 12/13/2016 11:38 AM EST ----- Regarding: BP Please check on patient's BP since he resumed his medications.

## 2017-06-08 ENCOUNTER — Other Ambulatory Visit: Payer: Self-pay | Admitting: Primary Care

## 2017-06-08 DIAGNOSIS — R7303 Prediabetes: Secondary | ICD-10-CM

## 2017-06-08 DIAGNOSIS — E785 Hyperlipidemia, unspecified: Secondary | ICD-10-CM

## 2017-06-15 ENCOUNTER — Other Ambulatory Visit: Payer: Federal, State, Local not specified - PPO

## 2017-12-11 ENCOUNTER — Other Ambulatory Visit (INDEPENDENT_AMBULATORY_CARE_PROVIDER_SITE_OTHER): Payer: Federal, State, Local not specified - PPO

## 2017-12-11 DIAGNOSIS — E785 Hyperlipidemia, unspecified: Secondary | ICD-10-CM | POA: Diagnosis not present

## 2017-12-11 DIAGNOSIS — R7303 Prediabetes: Secondary | ICD-10-CM

## 2017-12-11 LAB — LIPID PANEL
CHOL/HDL RATIO: 4
CHOLESTEROL: 178 mg/dL (ref 0–200)
HDL: 39.5 mg/dL (ref >39.00)
LDL Cholesterol: 108 mg/dL — ABNORMAL HIGH (ref 0–99)
NonHDL: 138.13
TRIGLYCERIDES: 151 mg/dL — AB (ref 0.0–149.0)
VLDL: 30.2 mg/dL (ref 0.0–40.0)

## 2017-12-11 LAB — HEMOGLOBIN A1C: Hgb A1c MFr Bld: 6 % (ref 4.6–6.5)

## 2017-12-12 ENCOUNTER — Other Ambulatory Visit (INDEPENDENT_AMBULATORY_CARE_PROVIDER_SITE_OTHER): Payer: Federal, State, Local not specified - PPO

## 2017-12-12 DIAGNOSIS — I10 Essential (primary) hypertension: Secondary | ICD-10-CM

## 2017-12-12 LAB — COMPREHENSIVE METABOLIC PANEL
ALK PHOS: 62 U/L (ref 39–117)
ALT: 7 U/L (ref 0–53)
AST: 10 U/L (ref 0–37)
Albumin: 4 g/dL (ref 3.5–5.2)
BUN: 25 mg/dL — AB (ref 6–23)
CO2: 24 mEq/L (ref 19–32)
Calcium: 9.7 mg/dL (ref 8.4–10.5)
Chloride: 104 mEq/L (ref 96–112)
Creatinine, Ser: 0.92 mg/dL (ref 0.40–1.50)
GFR: 104.25 mL/min (ref >60.00)
GLUCOSE: 162 mg/dL — AB (ref 70–99)
POTASSIUM: 3.4 meq/L — AB (ref 3.5–5.1)
SODIUM: 139 meq/L (ref 135–145)
Total Bilirubin: 0.3 mg/dL (ref 0.2–1.2)
Total Protein: 7.2 g/dL (ref 6.0–8.3)

## 2017-12-17 ENCOUNTER — Encounter: Payer: Self-pay | Admitting: Primary Care

## 2017-12-17 ENCOUNTER — Ambulatory Visit (INDEPENDENT_AMBULATORY_CARE_PROVIDER_SITE_OTHER): Payer: Federal, State, Local not specified - PPO | Admitting: Primary Care

## 2017-12-17 VITALS — BP 136/74 | HR 65 | Temp 98.0°F | Ht 69.0 in | Wt 209.5 lb

## 2017-12-17 DIAGNOSIS — I10 Essential (primary) hypertension: Secondary | ICD-10-CM | POA: Diagnosis not present

## 2017-12-17 DIAGNOSIS — F338 Other recurrent depressive disorders: Secondary | ICD-10-CM | POA: Diagnosis not present

## 2017-12-17 DIAGNOSIS — Z0000 Encounter for general adult medical examination without abnormal findings: Secondary | ICD-10-CM | POA: Insufficient documentation

## 2017-12-17 DIAGNOSIS — E785 Hyperlipidemia, unspecified: Secondary | ICD-10-CM

## 2017-12-17 DIAGNOSIS — I2510 Atherosclerotic heart disease of native coronary artery without angina pectoris: Secondary | ICD-10-CM | POA: Diagnosis not present

## 2017-12-17 DIAGNOSIS — R6882 Decreased libido: Secondary | ICD-10-CM | POA: Diagnosis not present

## 2017-12-17 DIAGNOSIS — Q5561 Curvature of penis (lateral): Secondary | ICD-10-CM

## 2017-12-17 DIAGNOSIS — R7303 Prediabetes: Secondary | ICD-10-CM | POA: Diagnosis not present

## 2017-12-17 DIAGNOSIS — Z23 Encounter for immunization: Secondary | ICD-10-CM

## 2017-12-17 NOTE — Assessment & Plan Note (Signed)
A1C of 6.0 on recent labs. Discussed the importance of a healthy diet and regular exercise in order for weight loss, and to reduce the risk of any potential medical problems.  Discussed specifically to stop drinking juice, eating cookies/candy daily. Discussed to start exercising.  Repeat labs in 6 months.

## 2017-12-17 NOTE — Assessment & Plan Note (Signed)
Influenza vaccination provided today. Pneumonia vaccinations UTD.  Colonoscopy UTD per patient, he will check records and notify us for sure. PSA UTD, due in 2020. Discussed the importance of a healthy diet and regular exercise in order for weight loss, and to reduce the risk of any potential medical problems. Exam unremarkable. Labs with prediabetes and hyperlipidemia. Follow up in 1 year. Repeat labs sooner.

## 2017-12-17 NOTE — Assessment & Plan Note (Signed)
PHQ 9 score of 11 today. Endorses he feels this way every winter. Offered help in terms of therapy, he kindly declines and will update [place] if symptoms persist into Spring/Summer months.

## 2017-12-17 NOTE — Addendum Note (Signed)
Addended by: Jacqualin Combes on: 12/17/2017 02:59 PM   Modules accepted: Orders

## 2017-12-17 NOTE — Progress Notes (Addendum)
Subjective:    [name], male    DOB: May 23, 1946, 72 y.o.   MRN: [id number]  HPI  Bryan Calderon is a 72 year old male who presents today for complete physical.  Immunizations: -Tetanus: Unsure -Influenza: Due -Pneumonia: Prevnar in 2017, Pneumovax in 2018 -Shingles: Completed in 2017  Diet: He endorses a fair diet Breakfast: Skips Lunch: Skips Dinner: Chicken, hamburger, fries, vegetables  Snacks: Fruit, cookies Desserts: Occasional candy, something sweet daily Beverages: Juice, water, occasional soda  Exercise: He does not currently exercise Eye exam: Completed several years ago.  Dental exam:  Completed one year ago.  Colonoscopy: Believes it's been within 10 years.  PSA: Negative in 2017 Hep C Screen: Negative in 2018   Review of Systems  Constitutional: Negative for unexpected weight change.  HENT: Negative for rhinorrhea.   Respiratory: Negative for cough and shortness of breath.   Cardiovascular: Negative for chest pain.  Gastrointestinal: Negative for constipation and diarrhea.  Genitourinary: Negative for difficulty urinating, dysuria, frequency, hematuria and urgency.       Curvature of penis x 2-3 months. Decreased libido.  Musculoskeletal: Negative for arthralgias and myalgias.  Skin: Negative for rash.  Allergic/Immunologic: Negative for environmental allergies.  Neurological: Negative for dizziness, numbness and headaches.  Psychiatric/Behavioral:       PHQ 9 score of 11 today       Past Medical History:  Diagnosis Date  . Alcohol abuse   . Coronary artery disease 2015  . GERD (gastroesophageal reflux disease)   . Heart disease   . Hyperlipidemia   . Hypertension      Social History   Socioeconomic History  . Marital status: Married    Spouse name: Not on file  . Number of children: Not on file  . Years of education: Not on file  . Highest education level: Not on file  Social Needs  . Financial resource strain: Not on  file  . Food insecurity - worry: Not on file  . Food insecurity - inability: Not on file  . Transportation needs - medical: Not on file  . Transportation needs - non-medical: Not on file  Occupational History  . Not on file  Tobacco Use  . Smoking status: Current Some Day Smoker    Packs/day: 1.00    Years: 35.00    Pack years: 35.00    Types: Cigarettes  . Smokeless tobacco: Never Used  . Tobacco comment: about 3 cigarettes a day  Substance and Sexual Activity  . Alcohol use: Yes    Alcohol/week: 0.0 oz    Comment: weekend socially  . Drug use: No  . Sexual activity: Not on file  Other Topics Concern  . Not on file  Social History Narrative   Married.   8 children   Retired. Looking for part time work.   Enjoys fishing, sports, spending time with family.    Past Surgical History:  Procedure Laterality Date  . APPENDECTOMY    . CARDIAC CATHETERIZATION  08/03/2014  . CARDIAC CATHETERIZATION  [place], [place]   . CAROTID STENT  2015 and 2004  . CORONARY ANGIOPLASTY WITH STENT PLACEMENT  08/03/2014  . CORONARY ANGIOPLASTY WITH STENT PLACEMENT  2005   [place], [place]   . SHOULDER SURGERY Bilateral    Rotator Cuff Repair    Family History  Problem Relation Age of Onset  . Heart disease Maternal Grandmother   . Heart attack Maternal Grandmother     No  Known Allergies  Current Outpatient Medications on File Prior to Visit  Medication Sig Dispense Refill  . atorvastatin (LIPITOR) 40 MG tablet Take 1 tablet (40 mg total) by mouth at bedtime. For cholesterol 90 tablet 3  . hydrochlorothiazide (HYDRODIURIL) 25 MG tablet TAKE 1 TABLET BY MOUTH DAILY FOR SWELLING AND BLOOD PRESSURE 90 tablet 3  . lisinopril (PRINIVIL,ZESTRIL) 10 MG tablet Take 1 tablet (10 mg total) by mouth daily. For blood pressure 90 tablet 3  . metoprolol (LOPRESSOR) 50 MG tablet TAKE 1 TABLET BY MOUTH TWICE A DAY 180 tablet 1  . nitroGLYCERIN (NITROSTAT) 0.4 MG SL tablet TAKE 1 TABLET EVERY 5 MINS  AS NEEDED AS DIRECTED  0  . vitamin E 400 UNIT capsule Take 400 Units by mouth. Taking 3 times a week     No current facility-administered medications on file prior to visit.     BP 136/74   Pulse 65   Temp 98 F (36.7 C) (Oral)   Ht 5\' 9"  (1.753 m)   Wt 209 lb 8 oz (95 kg)   SpO2 96%   BMI 30.94 kg/m    Objective:   Physical Exam  Constitutional: He is oriented to person, place, and time. He appears well-nourished.  HENT:  Right Ear: Tympanic membrane and ear canal normal.  Left Ear: Tympanic membrane and ear canal normal.  Nose: Nose normal. Right sinus exhibits no maxillary sinus tenderness and no frontal sinus tenderness. Left sinus exhibits no maxillary sinus tenderness and no frontal sinus tenderness.  Mouth/Throat: Oropharynx is clear and moist.  Eyes: Conjunctivae and EOM are normal. Pupils are equal, round, and reactive to light.  Neck: Neck supple. Carotid bruit is not present. No thyromegaly present.  Cardiovascular: Normal rate, regular rhythm and normal heart sounds.  Pulmonary/Chest: Effort normal and breath sounds normal. He has no wheezes. He has no rales.  Abdominal: Soft. Bowel sounds are normal. There is no tenderness.  Musculoskeletal: Normal range of motion.  Neurological: He is alert and oriented to person, place, and time. He has normal reflexes. No cranial nerve deficit.  Skin: Skin is warm and dry.  Psychiatric: He has a normal mood and affect.          Assessment & Plan:

## 2017-12-17 NOTE — Assessment & Plan Note (Signed)
LDL above goal, misses Lipitor 2-3 times weekly. He will resume on a daily basis, repeat lipids in 6 weeks. Consider increasing dose to 80 mg if no improvement.

## 2017-12-17 NOTE — Patient Instructions (Addendum)
Start exercising. You should be getting 150 minutes of moderate intensity exercise weekly.  It's important to improve your diet by reducing consumption of fried food, processed snack foods, sugary drinks. Increase consumption of fresh vegetables and fruits, whole grains, water.  Ensure you are drinking 64 ounces of water daily.  You will be contacted regarding your referral to Urology.  Please let us know if you have not been contacted within one week.   Schedule a lab only appointment in 6 weeks so we can recheck your cholesterol. Do not eat 8 hours prior. You can have water and black coffee only.  Schedule another lab appointment in 6 months to repeat the diabetes test.  It was a pleasure to see you today!   Prediabetes Prediabetes is the condition of having a blood sugar (blood glucose) level that is higher than it should be, but not high enough for you to be diagnosed with type 2 diabetes. Having prediabetes puts you at risk for developing type 2 diabetes (type 2 diabetes mellitus). Prediabetes may be called impaired glucose tolerance or impaired fasting glucose. Prediabetes usually does not cause symptoms. Your health care provider can diagnose this condition with blood tests. You may be tested for prediabetes if you are overweight and if you have at least one other risk factor for prediabetes. Risk factors for prediabetes include:  Having a family member with type 2 diabetes.  Being overweight or obese.  Being older than age 27.  Being of American-Indian, African-American, Hispanic/Latino, or Asian/Pacific Islander descent.  Having an inactive (sedentary) lifestyle.  Having a history of gestational diabetes or polycystic ovarian syndrome (PCOS).  Having low levels of good cholesterol (HDL-C) or high levels of blood fats (triglycerides).  Having high blood pressure.  What is blood glucose and how is blood glucose measured?  Blood glucose refers to the amount of glucose in  your bloodstream. Glucose comes from eating foods that contain sugars and starches (carbohydrates) that the body breaks down into glucose. Your blood glucose level may be measured in mg/dL (milligrams per deciliter) or mmol/L (millimoles per liter).Your blood glucose may be checked with one or more of the following blood tests:  A fasting blood glucose (FBG) test. You will not be allowed to eat (you will fast) for at least 8 hours before a blood sample is taken. ? A normal range for FBG is 70-100 mg/dl (3.9-5.6 mmol/L).  An A1c (hemoglobin A1c) blood test. This test provides information about blood glucose control over the previous 2?55months.  An oral glucose tolerance test (OGTT). This test measures your blood glucose twice: ? After fasting. This is your baseline level. ? Two hours after you drink a beverage that contains glucose.  You may be diagnosed with prediabetes:  If your FBG is 100?125 mg/dL (5.6-6.9 mmol/L).  If your A1c level is 5.7?6.4%.  If your OGGT result is 140?199 mg/dL (7.8-11 mmol/L).  These blood tests may be repeated to confirm your diagnosis. What happens if blood glucose is too high? The pancreas produces a hormone (insulin) that helps move glucose from the bloodstream into cells. When cells in the body do not respond properly to insulin that the body makes (insulin resistance), excess glucose builds up in the blood instead of going into cells. As a result, high blood glucose (hyperglycemia) can develop, which can cause many complications. This is a symptom of prediabetes. What can happen if blood glucose stays higher than normal for a long time? Having high blood glucose for a  long time is dangerous. Too much glucose in your blood can damage your nerves and blood vessels. Long-term damage can lead to complications from diabetes, which may include:  Heart disease.  Stroke.  Blindness.  Kidney disease.  Depression.  Poor circulation in the feet and legs,  which could lead to surgical removal (amputation) in severe cases.  How can prediabetes be prevented from turning into type 2 diabetes?  To help prevent type 2 diabetes, take the following actions:  Be physically active. ? Do moderate-intensity physical activity for at least 30 minutes on at least 5 days of the week, or as much as told by your health care provider. This could be brisk walking, biking, or water aerobics. ? Ask your health care provider what activities are safe for you. A mix of physical activities may be best, such as walking, swimming, cycling, and strength training.  Lose weight as told by your health care provider. ? Losing 5-7% of your body weight can reverse insulin resistance. ? Your health care provider can determine how much weight loss is best for you and can help you lose weight safely.  Follow a healthy meal plan. This includes eating lean proteins, complex carbohydrates, fresh fruits and vegetables, low-fat dairy products, and healthy fats. ? Follow instructions from your health care provider about eating or drinking restrictions. ? Make an appointment to see a diet and nutrition specialist (registered dietitian) to help you create a healthy eating plan that is right for you.  Do not smoke or use any tobacco products, such as cigarettes, chewing tobacco, and e-cigarettes. If you need help quitting, ask your health care provider.  Take over-the-counter and prescription medicines as told by your health care provider. You may be prescribed medicines that help lower the risk of type 2 diabetes.  This information is not intended to replace advice given to you by your health care provider. Make sure you discuss any questions you have with your health care provider. Document Released: 01/17/2016 Document Revised: 03/02/2016 Document Reviewed: 11/16/2015 Elsevier Interactive Patient Education  [employer].

## 2017-12-17 NOTE — Assessment & Plan Note (Signed)
Misses his atorvastatin dose 2-3 days weekly on average. LDL above goal at 108 on recent labs. 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   He will start taking daily, repeat lipids in 6 weeks. If LDL above goal of 70, then will increase atorvastatin.   Recommended he work on regular exercise.

## 2017-12-17 NOTE — Addendum Note (Signed)
Addended by: Pleas Koch on: 12/17/2017 12:06 PM   Modules accepted: Orders

## 2017-12-17 NOTE — Assessment & Plan Note (Signed)
Stable in the office today, also on home readings. Continue HCTZ 25 mg, lisinopril 10 mg, metoprolol tartrate 50 mg BID. BMP stable.

## 2017-12-17 NOTE — Assessment & Plan Note (Signed)
Continues. Also with curvature of penis x 2 months. Referral placed to Urology for further evaluation.

## 2018-01-16 ENCOUNTER — Ambulatory Visit: Payer: Self-pay | Admitting: Urology

## 2018-01-28 ENCOUNTER — Other Ambulatory Visit (INDEPENDENT_AMBULATORY_CARE_PROVIDER_SITE_OTHER): Payer: Federal, State, Local not specified - PPO

## 2018-01-28 DIAGNOSIS — E785 Hyperlipidemia, unspecified: Secondary | ICD-10-CM | POA: Diagnosis not present

## 2018-01-28 LAB — LIPID PANEL
CHOL/HDL RATIO: 3
Cholesterol: 118 mg/dL (ref 0–200)
HDL: 39 mg/dL — AB (ref >39.00)
LDL CALC: 65 mg/dL (ref 0–99)
NONHDL: 78.78
Triglycerides: 69 mg/dL (ref 0.0–149.0)
VLDL: 13.8 mg/dL (ref 0.0–40.0)

## 2018-01-29 ENCOUNTER — Encounter: Payer: Self-pay | Admitting: *Deleted

## 2018-02-04 ENCOUNTER — Other Ambulatory Visit: Payer: Self-pay | Admitting: Primary Care

## 2018-02-04 DIAGNOSIS — I10 Essential (primary) hypertension: Secondary | ICD-10-CM

## 2018-02-04 DIAGNOSIS — E785 Hyperlipidemia, unspecified: Secondary | ICD-10-CM

## 2018-02-06 MED ORDER — METOPROLOL TARTRATE 50 MG PO TABS: 50.0000 mg | ORAL_TABLET | Freq: Two times a day (BID) | ORAL | 1 refills | Status: DC

## 2018-02-06 MED ORDER — HYDROCHLOROTHIAZIDE 25 MG PO TABS: 25.0000 mg | ORAL_TABLET | Freq: Every day | ORAL | 1 refills | Status: DC

## 2018-02-13 ENCOUNTER — Other Ambulatory Visit: Payer: Self-pay | Admitting: Primary Care

## 2018-02-13 DIAGNOSIS — I2510 Atherosclerotic heart disease of native coronary artery without angina pectoris: Secondary | ICD-10-CM

## 2018-02-13 MED ORDER — NITROGLYCERIN 0.4 MG SL SUBL: SUBLINGUAL_TABLET | SUBLINGUAL | 0 refills | Status: AC

## 2018-02-13 NOTE — Telephone Encounter (Signed)
Called patient, no answer. Left message for him to call back regarding his request for NTG.  It was last filled on 07/19/15.  Provider  Allie Bossier, NP LOV  12/17/17

## 2018-02-13 NOTE — Telephone Encounter (Signed)
Copied from [place]. Topic: Quick Communication - Rx Refill/Question >> Feb 13, 2018  8:20 AM Boyd Kerbs wrote: Medication:  nitroGLYCERIN (NITROSTAT) 0.4 MG SL tablet  He is asking to have just in case on hand  He says he is not having any problems. Working out - starting exercise class today   Has the patient contacted their pharmacy? Yes.   (Agent: If no, request that the patient contact the pharmacy for the refill.) Preferred Pharmacy (with phone number or street name):   [id number] [address] [place] [address] Phone: [phone] Fax: [phone]   Agent: Please be advised that RX refills may take up to 3 business days. We ask that you follow-up with your pharmacy.

## 2018-02-13 NOTE — Telephone Encounter (Signed)
Noted, refill sent to pharmacy. 

## 2018-02-20 ENCOUNTER — Ambulatory Visit: Payer: Federal, State, Local not specified - PPO | Admitting: Urology

## 2018-02-27 ENCOUNTER — Encounter: Payer: Self-pay | Admitting: Primary Care

## 2018-02-27 ENCOUNTER — Ambulatory Visit: Payer: Federal, State, Local not specified - PPO | Admitting: Primary Care

## 2018-02-27 VITALS — BP 126/74 | HR 74 | Temp 98.1°F | Ht 69.0 in | Wt 210.8 lb

## 2018-02-27 DIAGNOSIS — M25512 Pain in left shoulder: Secondary | ICD-10-CM | POA: Diagnosis not present

## 2018-02-27 DIAGNOSIS — G8929 Other chronic pain: Secondary | ICD-10-CM | POA: Diagnosis not present

## 2018-02-27 NOTE — Progress Notes (Signed)
Subjective:    [name], male    DOB: [birth year], 72 y.o.   MRN: [id number]  HPI  Bryan Calderon is a 72 year old male who presents today with multiple complaints.  1) Ankle Swelling: Located to the right ankle which he first noticed 4 days ago. He has noticed swelling to his left ankle that hasn't been as bad. He has had swelling occur like this once in the past, was treated with "fluid pills". He's been walking more frequently, has also joined the gym. He was pressure washing his patio yesterday, noticed increased swelling after that. He's drinking mostly Gatorade and water. He stopped drinking alcohol months ago.  He did accidentally cause abrasions to his right lower ankle with a garden hose yesterday. He's cleansed the site, denies pain.   2) Shoulder Pain: History of arthroscopic surgery to left and right shoulder from torn rotator cuffs years ago. He's started to notice return of pain to the left shoulder and cannot sleep on his left side due to discomfort. He's also noticed decrease in ROM. Symptoms to his left shoulder have been present for the past 6 months. He denies recent injury/trauma, numbness tingling, weakness.   He has home PT exercises but hasn't done them recently.  Review of Systems  Respiratory: Negative for chest tightness and shortness of breath.   Cardiovascular: Positive for leg swelling. Negative for chest pain.  Musculoskeletal: Positive for arthralgias.  Skin: Negative for color change.  Neurological: Negative for weakness and numbness.       Past Medical History:  Diagnosis Date  . Alcohol abuse   . Coronary artery disease 2015  . GERD (gastroesophageal reflux disease)   . Heart disease   . Hyperlipidemia   . Hypertension      Social History   Socioeconomic History  . Marital status: Married    Spouse name: Not on file  . Number of children: Not on file  . Years of education: Not on file  . Highest education level: Not on  file  Occupational History  . Not on file  Social Needs  . Financial resource strain: Not on file  . Food insecurity:    Worry: Not on file    Inability: Not on file  . Transportation needs:    Medical: Not on file    Non-medical: Not on file  Tobacco Use  . Smoking status: Current Some Day Smoker    Packs/day: 1.00    Years: 35.00    Pack years: 35.00    Types: Cigarettes  . Smokeless tobacco: Never Used  . Tobacco comment: about 3 cigarettes a day  Substance and Sexual Activity  . Alcohol use: Yes    Alcohol/week: 0.0 oz    Comment: weekend socially  . Drug use: No  . Sexual activity: Not on file  Lifestyle  . Physical activity:    Days per week: Not on file    Minutes per session: Not on file  . Stress: Not on file  Relationships  . Social connections:    Talks on phone: Not on file    Gets together: Not on file    Attends religious service: Not on file    Active member of club or organization: Not on file    Attends meetings of clubs or organizations: Not on file    Relationship status: Not on file  . Intimate partner violence:    Fear of current or ex partner: Not on file  Emotionally abused: Not on file    Physically abused: Not on file    Forced sexual activity: Not on file  Other Topics Concern  . Not on file  Social History Narrative   Married.   8 children   Retired. Looking for part time work.   Enjoys fishing, sports, spending time with family.    Past Surgical History:  Procedure Laterality Date  . APPENDECTOMY    . CARDIAC CATHETERIZATION  08/03/2014  . CARDIAC CATHETERIZATION  [place], [place]   . CAROTID STENT  2015 and 2004  . CORONARY ANGIOPLASTY WITH STENT PLACEMENT  08/03/2014  . CORONARY ANGIOPLASTY WITH STENT PLACEMENT  2005   [place], [place]   . SHOULDER SURGERY Bilateral    Rotator Cuff Repair    Family History  Problem Relation Age of Onset  . Heart disease Maternal Grandmother   . Heart attack Maternal Grandmother      No Known Allergies  Current Outpatient Medications on File Prior to Visit  Medication Sig Dispense Refill  . atorvastatin (LIPITOR) 40 MG tablet TAKE 1 TABLET (40 MG TOTAL) BY MOUTH AT BEDTIME. FOR CHOLESTEROL 90 tablet 1  . hydrochlorothiazide (HYDRODIURIL) 25 MG tablet Take 1 tablet (25 mg total) by mouth daily. 90 tablet 1  . lisinopril (PRINIVIL,ZESTRIL) 10 MG tablet TAKE 1 TABLET (10 MG TOTAL) BY MOUTH DAILY. FOR BLOOD PRESSURE 90 tablet 1  . metoprolol tartrate (LOPRESSOR) 50 MG tablet Take 1 tablet (50 mg total) by mouth 2 (two) times daily. 180 tablet 1  . vitamin E 400 UNIT capsule Take 400 Units by mouth. Taking 3 times a week    . nitroGLYCERIN (NITROSTAT) 0.4 MG SL tablet TAKE 1 TABLET EVERY 5 MINS up to three doses as needed for chest pain. (Patient not taking: Reported on 02/27/2018) 30 tablet 0   No current facility-administered medications on file prior to visit.     BP 126/74   Pulse 74   Temp 98.1 F (36.7 C) (Oral)   Ht 5\' 9"  (1.753 m)   Wt 210 lb 12 oz (95.6 kg)   SpO2 97%   BMI 31.12 kg/m    Objective:   Physical Exam  Constitutional: He appears well-nourished.  Cardiovascular: Normal rate and regular rhythm.  Pulses:      Dorsalis pedis pulses are 2+ on the right side, and 2+ on the left side.       Posterior tibial pulses are 2+ on the right side, and 2+ on the left side.  Mild ankle edema to right ankle, no pitting.  Pulmonary/Chest: Effort normal and breath sounds normal.  Musculoskeletal:       Left shoulder: He exhibits decreased range of motion and pain. He exhibits no tenderness, no spasm and normal strength.  Negative empty can test. Decrease in ROM with lateral abduction, posterior abduction. 5/5 strength to upper extremities bilaterally.  Skin: Skin is warm and dry.  Two small abrasions to right lower anterior ankle. No erythema or bleeding.          Assessment & Plan:  Ankle Edema:  Present for the last 4 days. Exam today without  pitting or signs of fluid overload. Suspect this is secondary to increased physical activity. Discussed to wear compression socks, elevate extremities. He will update if symptoms persist.  Pleas Koch, NP

## 2018-02-27 NOTE — Assessment & Plan Note (Signed)
History of arthroscopic repair of torn rotator cuff years ago. Exam today without obvious rotator involvement but given history and symptoms will evaluate. Plain films of left shoulder pending.   Offered physical therapy for which he declines as he's been through PT before and understands what to do. Discussed evaluation from our Sports Medicine MD for injections vs evaluation. He will schedule an appointment.

## 2018-02-27 NOTE — Patient Instructions (Signed)
Return at your convenience for the xray of your shoulder.  It's okay to take Ibuprofen or Tylenol as needed for pain.  Resume the home physical therapy exercises to the left shoulder.  Consider scheduling an appointment with Dr. Lorelei Pont.  It was a pleasure to see you today!

## 2018-02-28 ENCOUNTER — Ambulatory Visit: Payer: Federal, State, Local not specified - PPO | Admitting: Family Medicine

## 2018-02-28 DIAGNOSIS — Z0289 Encounter for other administrative examinations: Secondary | ICD-10-CM

## 2018-03-12 ENCOUNTER — Ambulatory Visit: Payer: Federal, State, Local not specified - PPO | Admitting: Urology

## 2018-03-19 ENCOUNTER — Ambulatory Visit (INDEPENDENT_AMBULATORY_CARE_PROVIDER_SITE_OTHER)
Admission: RE | Admit: 2018-03-19 | Discharge: 2018-03-19 | Disposition: A | Discharge status: Home / Self Care | Payer: Federal, State, Local not specified - PPO | Source: Ambulatory Visit | Attending: Primary Care | Admitting: Primary Care

## 2018-03-19 DIAGNOSIS — M25512 Pain in left shoulder: Secondary | ICD-10-CM | POA: Diagnosis not present

## 2018-03-19 DIAGNOSIS — G8929 Other chronic pain: Secondary | ICD-10-CM

## 2018-04-24 ENCOUNTER — Encounter: Payer: Self-pay | Admitting: Family Medicine

## 2018-04-24 ENCOUNTER — Ambulatory Visit: Payer: Federal, State, Local not specified - PPO | Admitting: Family Medicine

## 2018-04-24 VITALS — BP 122/66 | HR 56 | Temp 98.6°F | Ht 69.0 in | Wt 210.2 lb

## 2018-04-24 DIAGNOSIS — M7502 Adhesive capsulitis of left shoulder: Secondary | ICD-10-CM

## 2018-04-24 NOTE — Progress Notes (Signed)
Dr. Frederico Hamman T. Magally Vahle, MD, [facility] [place], [address] Phone: [phone] Fax: [phone]  04/24/2018  Patient: Bryan Calderon, MRN: [id number], DOB: May 06, 1946, 72 y.o.  Primary Physician:  Pleas Koch, NP   Chief Complaint  Patient presents with  . Shoulder Pain    left, about 4 months   Subjective:   Bryan Calderon is a 72 y.o. very pleasant male patient who presents with the following:  RTC tear on the L in the past, now with loss of motion of the left side.   Now is having a dull ache in his left shoulder very much all the time, he has noticed some loss of motion.  Loss of motion in multiple planes of motion including abduction, internal and external range of motion.  Internal range of motion is the worst.  He does have pain at nighttime, this is occasionally waking him up from sleep.  He has had some functional loss of motion at the shoulder.  He does think that he had a prior rotator cuff repair on this side, but I do not have those records for my independent review. This was not done in our system.   Past Medical History, Surgical History, Social History, Family History, Problem List, Medications, and Allergies have been reviewed and updated if relevant.  Patient Active Problem List   Diagnosis Date Noted  . Chronic left shoulder pain 02/27/2018  . Preventative health care 12/17/2017  . Prediabetes 12/17/2017  . Seasonal depression ([place]) 12/17/2017  . Medicare annual wellness visit, subsequent 11/22/2015  . Decreased libido 11/22/2015  . CAD (coronary artery disease) 09/09/2015  . History of coronary artery stent placement 09/09/2015  . Smoker 09/09/2015  . COPD (chronic obstructive pulmonary disease) ([place]) 09/09/2015  . Essential hypertension 07/19/2015  . Hyperlipidemia 07/19/2015    Past Medical History:  Diagnosis Date  . Alcohol abuse   . Coronary artery disease 2015  .  GERD (gastroesophageal reflux disease)   . Heart disease   . Hyperlipidemia   . Hypertension     Past Surgical History:  Procedure Laterality Date  . APPENDECTOMY    . CARDIAC CATHETERIZATION  08/03/2014  . CARDIAC CATHETERIZATION  [place], [place]   . CAROTID STENT  2015 and 2004  . CORONARY ANGIOPLASTY WITH STENT PLACEMENT  08/03/2014  . CORONARY ANGIOPLASTY WITH STENT PLACEMENT  2005   [place], [place]   . SHOULDER SURGERY Bilateral    Rotator Cuff Repair    Social History   Socioeconomic History  . Marital status: Married    Spouse name: Not on file  . Number of children: Not on file  . Years of education: Not on file  . Highest education level: Not on file  Occupational History  . Not on file  Social Needs  . Financial resource strain: Not on file  . Food insecurity:    Worry: Not on file    Inability: Not on file  . Transportation needs:    Medical: Not on file    Non-medical: Not on file  Tobacco Use  . Smoking status: Current Some Day Smoker    Packs/day: 1.00    Years: 35.00    Pack years: 35.00    Types: Cigarettes  . Smokeless tobacco: Never Used  . Tobacco comment: about 3 cigarettes a day  Substance and Sexual Activity  . Alcohol use: Yes    Alcohol/week: 0.0 oz  Comment: weekend socially  . Drug use: No  . Sexual activity: Not on file  Lifestyle  . Physical activity:    Days per week: Not on file    Minutes per session: Not on file  . Stress: Not on file  Relationships  . Social connections:    Talks on phone: Not on file    Gets together: Not on file    Attends religious service: Not on file    Active member of club or organization: Not on file    Attends meetings of clubs or organizations: Not on file    Relationship status: Not on file  . Intimate partner violence:    Fear of current or ex partner: Not on file    Emotionally abused: Not on file    Physically abused: Not on file    Forced sexual activity: Not on file  Other  Topics Concern  . Not on file  Social History Narrative   Married.   8 children   Retired. Looking for part time work.   Enjoys fishing, sports, spending time with family.    Family History  Problem Relation Age of Onset  . Heart disease Maternal Grandmother   . Heart attack Maternal Grandmother     No Known Allergies  Medication list reviewed and updated in full in [facility].  GEN: No fevers, chills. Nontoxic. Primarily MSK c/o today. MSK: Detailed in the HPI GI: tolerating PO intake without difficulty Neuro: No numbness, parasthesias, or tingling associated. Otherwise the pertinent positives of the ROS are noted above.   Objective:   BP 122/66   Pulse (!) 56   Temp 98.6 F (37 C) (Oral)   Ht 5\' 9"  (1.753 m)   Wt 210 lb 4 oz (95.4 kg)   BMI 31.05 kg/m    GEN: WDWN, NAD, Non-toxic, Alert & Oriented x 3 HEENT: Atraumatic, Normocephalic.  Ears and Nose: No external deformity. EXTR: No clubbing/cyanosis/edema NEURO: Normal gait.  PSYCH: Normally interactive. Conversant. Not depressed or anxious appearing.  Calm demeanor.   Shoulder: R and L Inspection: No muscle wasting or winging Ecchymosis/edema: neg  AC joint, scapula, clavicle: NT Cervical spine: NT, full ROM Spurling's: neg ABNORMAL SIDE TESTED: L UNLESS OTHERWISE NOTED, THE CONTRALATERAL SIDE HAS FULL RANGE OF MOTION. Abduction: 5/5, LIMITED TO 160 DEGREES Flexion: 5/5, LIMITED TO 165 DEGNO ROM  IR, lift-off: 5/5. TESTED AT 90 DEGREES OF ABDUCTION, LIMITED TO 10 DEGREES ER at neutral:  5/5, TESTED AT 90 DEGREES OF ABDUCTION, LIMITED TO 40 DEGREES AC crossover and compression: PAIN Drop Test: neg Empty Can: neg Supraspinatus insertion: NT Bicipital groove: NT ALL OTHER SPECIAL TESTING EQUIVOCAL GIVEN LOSS OF MOTION C5-T1 intact Sensation intact Grip 5/5    Radiology: No results found.   Assessment and Plan:   Adhesive capsulitis of left shoulder  >25 minutes spent in face to face time  with patient, >50% spent in counselling or coordination of care   Patient was given a systematic ROM protocol from [employer] to be done daily. Emphasized adherence and recommended formal PT, but he wants to hold on this for now.   Tylenol or NSAID of choice prn for pain relief  Patient will be sent for formal PT for aggressive frozen shoulder ROM. Will need RTC str and scapular stabilization to fix underlying mechanics.  Intraarticular corticosteroid injections in Adhesive Capsulitis have clearly been shown to be of benefit, but he would like to hold off for now.    Follow-up:  Return in about 2 months (around 06/25/2018).  Signed,  Maud Deed. Lyrah Bradt, MD   Allergies as of 04/24/2018   No Known Allergies     Medication List        Accurate as of 04/24/18 11:59 PM. Always use your most recent med list.          atorvastatin 40 MG tablet Commonly known as:  LIPITOR TAKE 1 TABLET (40 MG TOTAL) BY MOUTH AT BEDTIME. FOR CHOLESTEROL   hydrochlorothiazide 25 MG tablet Commonly known as:  HYDRODIURIL Take 1 tablet (25 mg total) by mouth daily.   lisinopril 10 MG tablet Commonly known as:  PRINIVIL,ZESTRIL TAKE 1 TABLET (10 MG TOTAL) BY MOUTH DAILY. FOR BLOOD PRESSURE   metoprolol tartrate 50 MG tablet Commonly known as:  LOPRESSOR Take 1 tablet (50 mg total) by mouth 2 (two) times daily.   nitroGLYCERIN 0.4 MG SL tablet Commonly known as:  NITROSTAT TAKE 1 TABLET EVERY 5 MINS up to three doses as needed for chest pain.   vitamin E 400 UNIT capsule Take 400 Units by mouth. Taking 3 times a week

## 2018-05-08 ENCOUNTER — Ambulatory Visit: Payer: Federal, State, Local not specified - PPO | Admitting: Urology

## 2018-06-13 ENCOUNTER — Other Ambulatory Visit: Payer: Self-pay | Admitting: Primary Care

## 2018-06-13 DIAGNOSIS — R7303 Prediabetes: Secondary | ICD-10-CM

## 2018-06-19 ENCOUNTER — Other Ambulatory Visit (INDEPENDENT_AMBULATORY_CARE_PROVIDER_SITE_OTHER): Payer: Federal, State, Local not specified - PPO

## 2018-06-19 DIAGNOSIS — R7303 Prediabetes: Secondary | ICD-10-CM | POA: Diagnosis not present

## 2018-06-19 LAB — POCT GLYCOSYLATED HEMOGLOBIN (HGB A1C): Hemoglobin A1C: 5.9 % — AB (ref 4.0–5.6)

## 2018-06-21 ENCOUNTER — Encounter: Payer: Self-pay | Admitting: *Deleted

## 2018-07-10 ENCOUNTER — Ambulatory Visit: Payer: Federal, State, Local not specified - PPO | Admitting: Urology

## 2018-07-31 ENCOUNTER — Other Ambulatory Visit: Payer: Self-pay | Admitting: Primary Care

## 2018-07-31 DIAGNOSIS — I10 Essential (primary) hypertension: Secondary | ICD-10-CM

## 2018-07-31 DIAGNOSIS — E785 Hyperlipidemia, unspecified: Secondary | ICD-10-CM

## 2018-11-15 ENCOUNTER — Other Ambulatory Visit: Payer: Self-pay | Admitting: Primary Care

## 2018-11-15 DIAGNOSIS — I10 Essential (primary) hypertension: Secondary | ICD-10-CM

## 2019-03-15 ENCOUNTER — Other Ambulatory Visit: Payer: Self-pay | Admitting: Primary Care

## 2019-03-15 DIAGNOSIS — I10 Essential (primary) hypertension: Secondary | ICD-10-CM

## 2019-03-18 ENCOUNTER — Other Ambulatory Visit: Payer: Self-pay | Admitting: Primary Care

## 2019-03-18 DIAGNOSIS — I10 Essential (primary) hypertension: Secondary | ICD-10-CM

## 2019-03-18 DIAGNOSIS — E785 Hyperlipidemia, unspecified: Secondary | ICD-10-CM

## 2019-03-29 ENCOUNTER — Other Ambulatory Visit: Payer: Self-pay | Admitting: Primary Care

## 2019-03-29 DIAGNOSIS — I10 Essential (primary) hypertension: Secondary | ICD-10-CM

## 2019-04-01 ENCOUNTER — Other Ambulatory Visit: Payer: Self-pay | Admitting: Primary Care

## 2019-04-01 DIAGNOSIS — I10 Essential (primary) hypertension: Secondary | ICD-10-CM

## 2019-05-09 ENCOUNTER — Telehealth: Payer: Self-pay

## 2019-05-09 NOTE — Telephone Encounter (Signed)
If no symptoms after 2 weeks of quarantine post exposure then he does not need to be tested.

## 2019-05-09 NOTE — Telephone Encounter (Signed)
Pts wife(DPR signed) said on 04/25/19 pt went to funeral and some of the family members from [place] came by pts home after funeral; wore mask at funeral but not at home and not social distanced at the home. Pts family returned to [place] and tested positive for covid; pt has no symptoms and has not traveled but was exposed to + covid. Pt has self quarantined which will be over on 05/11/19. Pt s wife wants to know if they need to be tested. Please advise. pts wife is not [facility] pt and was advised to ck with her PCP.

## 2019-05-09 NOTE — Telephone Encounter (Signed)
Per DPR, left detail message of Tawni Millers comments for patient and patient's wife to call back.

## 2019-05-20 ENCOUNTER — Other Ambulatory Visit: Payer: Self-pay | Admitting: Primary Care

## 2019-05-20 DIAGNOSIS — I10 Essential (primary) hypertension: Secondary | ICD-10-CM

## 2019-05-23 ENCOUNTER — Other Ambulatory Visit: Payer: Self-pay

## 2019-05-23 ENCOUNTER — Encounter: Payer: Self-pay | Admitting: Primary Care

## 2019-05-23 ENCOUNTER — Ambulatory Visit (INDEPENDENT_AMBULATORY_CARE_PROVIDER_SITE_OTHER): Payer: Federal, State, Local not specified - PPO | Admitting: Primary Care

## 2019-05-23 VITALS — BP 126/76 | HR 56 | Temp 97.8°F | Ht 69.0 in | Wt 209.5 lb

## 2019-05-23 DIAGNOSIS — Z125 Encounter for screening for malignant neoplasm of prostate: Secondary | ICD-10-CM | POA: Diagnosis not present

## 2019-05-23 DIAGNOSIS — Z0000 Encounter for general adult medical examination without abnormal findings: Secondary | ICD-10-CM

## 2019-05-23 DIAGNOSIS — R7303 Prediabetes: Secondary | ICD-10-CM | POA: Diagnosis not present

## 2019-05-23 DIAGNOSIS — J432 Centrilobular emphysema: Secondary | ICD-10-CM

## 2019-05-23 DIAGNOSIS — E785 Hyperlipidemia, unspecified: Secondary | ICD-10-CM

## 2019-05-23 DIAGNOSIS — F17200 Nicotine dependence, unspecified, uncomplicated: Secondary | ICD-10-CM

## 2019-05-23 DIAGNOSIS — I2510 Atherosclerotic heart disease of native coronary artery without angina pectoris: Secondary | ICD-10-CM

## 2019-05-23 DIAGNOSIS — I10 Essential (primary) hypertension: Secondary | ICD-10-CM

## 2019-05-23 LAB — COMPREHENSIVE METABOLIC PANEL
ALT: 19 U/L (ref 0–53)
AST: 23 U/L (ref 0–37)
Albumin: 4.2 g/dL (ref 3.5–5.2)
Alkaline Phosphatase: 68 U/L (ref 39–117)
BUN: 20 mg/dL (ref 6–23)
CO2: 28 mEq/L (ref 19–32)
Calcium: 9.1 mg/dL (ref 8.4–10.5)
Chloride: 103 mEq/L (ref 96–112)
Creatinine, Ser: 0.83 mg/dL (ref 0.40–1.50)
GFR: 110.01 mL/min (ref >60.00)
Glucose, Bld: 90 mg/dL (ref 70–99)
Potassium: 3.5 mEq/L (ref 3.5–5.1)
Sodium: 140 mEq/L (ref 135–145)
Total Bilirubin: 0.4 mg/dL (ref 0.2–1.2)
Total Protein: 6.7 g/dL (ref 6.0–8.3)

## 2019-05-23 LAB — LIPID PANEL
Cholesterol: 152 mg/dL (ref 0–200)
HDL: 55.7 mg/dL (ref >39.00)
LDL Cholesterol: 62 mg/dL (ref 0–99)
NonHDL: 96.39
Total CHOL/HDL Ratio: 3
Triglycerides: 170 mg/dL — ABNORMAL HIGH (ref 0.0–149.0)
VLDL: 34 mg/dL (ref 0.0–40.0)

## 2019-05-23 LAB — HEMOGLOBIN A1C: Hgb A1c MFr Bld: 5.8 % (ref 4.6–6.5)

## 2019-05-23 LAB — PSA, MEDICARE: PSA: 2.8 ng/ml (ref 0.10–4.00)

## 2019-05-23 NOTE — Patient Instructions (Addendum)
Stop by the lab prior to leaving today. I will notify you of your results once received.   Start exercising. You should be getting 150 minutes of moderate intensity exercise weekly.  Continue to work on a healthy diet.   Ensure you are consuming 64 ounces of water daily.  You will be contacted regarding your referral to lung cancer screening and ultrasound.  Please let us know if you have not been contacted within one week.   Let me know if and when you had the Shingles shot. Let me know where you had your colonoscopy completed.  It was a pleasure to see you today!    Preventive Care 72 Years and Older, Male Preventive care refers to lifestyle choices and visits with your health care provider that can promote health and wellness. This includes:  A yearly physical exam. This is also called an annual well check.  Regular dental and eye exams.  Immunizations.  Screening for certain conditions.  Healthy lifestyle choices, such as diet and exercise. What can I expect for my preventive care visit? Physical exam Your health care provider will check:  Height and weight. These may be used to calculate body mass index (BMI), which is a measurement that tells if you are at a healthy weight.  Heart rate and blood pressure.  Your skin for abnormal spots. Counseling Your health care provider may ask you questions about:  Alcohol, tobacco, and drug use.  Emotional well-being.  Home and relationship well-being.  Sexual activity.  Eating habits.  History of falls.  Memory and ability to understand (cognition).  Work and work Statistician. What immunizations do I need?  Influenza (flu) vaccine  This is recommended every year. Tetanus, diphtheria, and pertussis (Tdap) vaccine  You may need a Td booster every 10 years. Varicella (chickenpox) vaccine  You may need this vaccine if you have not already been vaccinated. Zoster (shingles) vaccine  You may need this after  age 72. Pneumococcal conjugate (PCV13) vaccine  One dose is recommended after age 5. Pneumococcal polysaccharide (PPSV23) vaccine  One dose is recommended after age 33. Measles, mumps, and rubella (MMR) vaccine  You may need at least one dose of MMR if you were born in 1957 or later. You may also need a second dose. Meningococcal conjugate (MenACWY) vaccine  You may need this if you have certain conditions. Hepatitis A vaccine  You may need this if you have certain conditions or if you travel or work in places where you may be exposed to hepatitis A. Hepatitis B vaccine  You may need this if you have certain conditions or if you travel or work in places where you may be exposed to hepatitis B. Haemophilus influenzae type b (Hib) vaccine  You may need this if you have certain conditions. You may receive vaccines as individual doses or as more than one vaccine together in one shot (combination vaccines). Talk with your health care provider about the risks and benefits of combination vaccines. What tests do I need? Blood tests  Lipid and cholesterol levels. These may be checked every 5 years, or more frequently depending on your overall health.  Hepatitis C test.  Hepatitis B test. Screening  Lung cancer screening. You may have this screening every year starting at age 72 if you have a 30-pack-year history of smoking and currently smoke or have quit within the past 15 years.  Colorectal cancer screening. All adults should have this screening starting at age 72 and continuing until  age 72. Your health care provider may recommend screening at age 46 if you are at increased risk. You will have tests every 1-10 years, depending on your results and the type of screening test.  Prostate cancer screening. Recommendations will vary depending on your family history and other risks.  Diabetes screening. This is done by checking your blood sugar (glucose) after you have not eaten for a  while (fasting). You may have this done every 1-3 years.  Abdominal aortic aneurysm (AAA) screening. You may need this if you are a current or former smoker.  Sexually transmitted disease (STD) testing. Follow these instructions at home: Eating and drinking  Eat a diet that includes fresh fruits and vegetables, whole grains, lean protein, and low-fat dairy products. Limit your intake of foods with high amounts of sugar, saturated fats, and salt.  Take vitamin and mineral supplements as recommended by your health care provider.  Do not drink alcohol if your health care provider tells you not to drink.  If you drink alcohol: ? Limit how much you have to 0-2 drinks a day. ? Be aware of how much alcohol is in your drink. In the [place] one drink equals one 12 oz bottle of beer (355 mL), one 5 oz glass of wine (148 mL), or one 1 oz glass of hard liquor (44 mL). Lifestyle  Take daily care of your teeth and gums.  Stay active. Exercise for at least 30 minutes on 5 or more days each week.  Do not use any products that contain nicotine or tobacco, such as cigarettes, e-cigarettes, and chewing tobacco. If you need help quitting, ask your health care provider.  If you are sexually active, practice safe sex. Use a condom or other form of protection to prevent STIs (sexually transmitted infections).  Talk with your health care provider about taking a low-dose aspirin or statin. What's next?  Visit your health care provider once a year for a well check visit.  Ask your health care provider how often you should have your eyes and teeth checked.  Stay up to date on all vaccines. This information is not intended to replace advice given to you by your health care provider. Make sure you discuss any questions you have with your health care provider. Document Released: 10/22/2015 Document Revised: 09/19/2018 Document Reviewed: 09/19/2018 Elsevier Patient Education  2020 [employer].

## 2019-05-23 NOTE — Assessment & Plan Note (Signed)
Stable in the office today, continue current regimen. BMP pending. 

## 2019-05-23 NOTE — Assessment & Plan Note (Signed)
Pneumonia vaccination UTD, influenza due this Fall. He thinks he had the Shingles vaccination but will confirm. PSA pending. Referral placed for lung cancer screening. He will notify me of the date and place of his last colonoscopy.  Exam today unremarkable. Labs pending.

## 2019-05-23 NOTE — Assessment & Plan Note (Signed)
40+ pack year history. Referral placed for lung cancer screening.

## 2019-05-23 NOTE — Assessment & Plan Note (Signed)
Infrequent symptoms. Strongly advised he quit smoking. Referral placed for lung cancer screening.

## 2019-05-23 NOTE — Assessment & Plan Note (Signed)
Compliant to atorvastatin daily, repeat lipids pending. Asymptomatic. Continue with BP and lipid control, healthy diet.

## 2019-05-23 NOTE — Progress Notes (Signed)
Subjective:    [name], male    DOB: Apr 28, 1946, 73 y.o.   MRN: [id number]  HPI  Bryan Calderon is a 73 year old male who presents today for complete physical.  Immunizations: -Influenza: Due this season  -Pneumonia: Completed last in 2018 -Shingles: Unsure  Diet: He endorses a fair diet. He is eating mostly baked lean protein, occasional fried protein. He is also eating vegetables, starch, salad. Desserts 3-4 times weekly. He is drinking water mostly, some juice/Gatorade, sweet tea.  Exercise: He is active in his yard, walking.  Eye exam: No recent exam Dental exam: Completes semi-annually  Colonoscopy: Completed within 10 years, completed in [place], [place].  PSA: Due Hep C Screen: Negative Lung Cancer Screening: Never completed   BP Readings from Last 3 Encounters:  05/23/19 126/76  04/24/18 122/66  02/27/18 126/74     Review of Systems  Constitutional: Negative for unexpected weight change.  HENT: Negative for rhinorrhea.   Respiratory: Negative for cough and shortness of breath.   Cardiovascular: Negative for chest pain.  Gastrointestinal: Negative for constipation and diarrhea.  Genitourinary: Negative for difficulty urinating.  Musculoskeletal: Positive for arthralgias.       Intermittent back pain  Skin: Negative for rash.  Allergic/Immunologic: Negative for environmental allergies.  Neurological: Negative for dizziness, numbness and headaches.  Psychiatric/Behavioral: The patient is not nervous/anxious.        Some depression lately due to loss of his younger son.       Past Medical History:  Diagnosis Date  . Alcohol abuse   . Coronary artery disease 2015  . GERD (gastroesophageal reflux disease)   . Heart disease   . Hyperlipidemia   . Hypertension      Social History   Socioeconomic History  . Marital status: Married    Spouse name: Not on file  . Number of children: Not on file  . Years of education: Not on file  .  Highest education level: Not on file  Occupational History  . Not on file  Social Needs  . Financial resource strain: Not on file  . Food insecurity    Worry: Not on file    Inability: Not on file  . Transportation needs    Medical: Not on file    Non-medical: Not on file  Tobacco Use  . Smoking status: Current Some Day Smoker    Packs/day: 1.00    Years: 35.00    Pack years: 35.00    Types: Cigarettes  . Smokeless tobacco: Never Used  . Tobacco comment: about 3 cigarettes a day  Substance and Sexual Activity  . Alcohol use: Yes    Alcohol/week: 0.0 standard drinks    Comment: weekend socially  . Drug use: No  . Sexual activity: Not on file  Lifestyle  . Physical activity    Days per week: Not on file    Minutes per session: Not on file  . Stress: Not on file  Relationships  . Social Herbalist on phone: Not on file    Gets together: Not on file    Attends religious service: Not on file    Active member of club or organization: Not on file    Attends meetings of clubs or organizations: Not on file    Relationship status: Not on file  . Intimate partner violence    Fear of current or ex partner: Not on file    Emotionally abused: Not on  file    Physically abused: Not on file    Forced sexual activity: Not on file  Other Topics Concern  . Not on file  Social History Narrative   Married.   8 children   Retired. Looking for part time work.   Enjoys fishing, sports, spending time with family.    Past Surgical History:  Procedure Laterality Date  . APPENDECTOMY    . CARDIAC CATHETERIZATION  08/03/2014  . CARDIAC CATHETERIZATION  [place], [place]   . CAROTID STENT  2015 and 2004  . CORONARY ANGIOPLASTY WITH STENT PLACEMENT  08/03/2014  . CORONARY ANGIOPLASTY WITH STENT PLACEMENT  2005   [place], [place]   . SHOULDER SURGERY Bilateral    Rotator Cuff Repair    Family History  Problem Relation Age of Onset  . Heart disease Maternal Grandmother   .  Heart attack Maternal Grandmother     No Known Allergies  Current Outpatient Medications on File Prior to Visit  Medication Sig Dispense Refill  . atorvastatin (LIPITOR) 40 MG tablet TAKE 1 TABLET (40 MG TOTAL) BY MOUTH AT BEDTIME. FOR CHOLESTEROL 90 tablet 0  . hydrochlorothiazide (HYDRODIURIL) 25 MG tablet TAKE 1 TABLET BY MOUTH DAILY FOR SWELLING AND BLOOD PRESSURE 30 tablet 0  . lisinopril (ZESTRIL) 10 MG tablet TAKE 1 TABLET (10 MG TOTAL) BY MOUTH DAILY. FOR BLOOD PRESSURE 90 tablet 0  . metoprolol tartrate (LOPRESSOR) 50 MG tablet TAKE 1 TABLET BY MOUTH TWICE A DAY 180 tablet 0  . nitroGLYCERIN (NITROSTAT) 0.4 MG SL tablet TAKE 1 TABLET EVERY 5 MINS up to three doses as needed for chest pain. 30 tablet 0  . vitamin E 400 UNIT capsule Take 400 Units by mouth. Taking 3 times a week     No current facility-administered medications on file prior to visit.     BP 126/76   Pulse (!) 56   Temp 97.8 F (36.6 C) (Temporal)   Ht 5\' 9"  (1.753 m)   Wt 209 lb 8 oz (95 kg)   SpO2 97%   BMI 30.94 kg/m    Objective:   Physical Exam  Constitutional: He is oriented to person, place, and time. He appears well-nourished.  HENT:  Mouth/Throat: No oropharyngeal exudate.  Eyes: Pupils are equal, round, and reactive to light. EOM are normal.  Neck: Neck supple. No thyromegaly present.  Cardiovascular: Normal rate and regular rhythm.  Respiratory: Effort normal and breath sounds normal.  GI: Soft. Bowel sounds are normal. There is no abdominal tenderness.  Musculoskeletal: Normal range of motion.  Neurological: He is alert and oriented to person, place, and time.  Skin: Skin is warm and dry.  2 cm raised, rounded, soft, immobile mass to left mid thoracic back just left of the spine. Non tender. No erythema.   Psychiatric: He has a normal mood and affect.           Assessment & Plan:

## 2019-05-23 NOTE — Assessment & Plan Note (Signed)
Repeat A1C pending.  Discussed the importance of a healthy diet and regular exercise in order for weight loss, and to reduce the risk of any potential medical problems.  

## 2019-05-23 NOTE — Assessment & Plan Note (Signed)
Repeat lipids pending, compliant to atorvastatin.  

## 2019-05-27 ENCOUNTER — Encounter: Payer: Self-pay | Admitting: *Deleted

## 2019-06-02 ENCOUNTER — Encounter: Payer: Self-pay | Admitting: *Deleted

## 2019-06-02 ENCOUNTER — Telehealth: Payer: Self-pay | Admitting: *Deleted

## 2019-06-02 DIAGNOSIS — Z87891 Personal history of nicotine dependence: Secondary | ICD-10-CM

## 2019-06-02 DIAGNOSIS — Z122 Encounter for screening for malignant neoplasm of respiratory organs: Secondary | ICD-10-CM

## 2019-06-02 NOTE — Telephone Encounter (Signed)
Received referral for initial lung cancer screening scan. Contacted patient and obtained smoking history,(current, 35 pack year) as well as answering questions related to screening process. Patient denies signs of lung cancer such as weight loss or hemoptysis. Patient denies comorbidity that would prevent curative treatment if lung cancer were found. Patient is scheduled for shared decision making visit and CT scan on 06/26/19 at 115pm.

## 2019-06-04 ENCOUNTER — Other Ambulatory Visit: Payer: Self-pay | Admitting: Primary Care

## 2019-06-04 DIAGNOSIS — I10 Essential (primary) hypertension: Secondary | ICD-10-CM

## 2019-06-26 ENCOUNTER — Inpatient Hospital Stay: Payer: Medicare Other | Attending: Nurse Practitioner | Admitting: Nurse Practitioner

## 2019-06-26 ENCOUNTER — Other Ambulatory Visit: Payer: Self-pay

## 2019-06-26 ENCOUNTER — Ambulatory Visit
Admission: RE | Admit: 2019-06-26 | Discharge: 2019-06-26 | Disposition: A | Discharge status: Home / Self Care | Payer: Federal, State, Local not specified - PPO | Source: Ambulatory Visit | Attending: Nurse Practitioner | Admitting: Nurse Practitioner

## 2019-06-26 DIAGNOSIS — Z87891 Personal history of nicotine dependence: Secondary | ICD-10-CM | POA: Diagnosis present

## 2019-06-26 DIAGNOSIS — F17210 Nicotine dependence, cigarettes, uncomplicated: Secondary | ICD-10-CM

## 2019-06-26 DIAGNOSIS — Z122 Encounter for screening for malignant neoplasm of respiratory organs: Secondary | ICD-10-CM | POA: Insufficient documentation

## 2019-06-26 NOTE — Progress Notes (Signed)
Virtual Visit via Video Enabled Telemedicine Note   I connected with Bryan Calderon on 06/26/19 at 1:15 PM EST by video enabled telemedicine visit and verified that I am speaking with the correct person using two identifiers.   I discussed the limitations, risks, security and privacy concerns of performing an evaluation and management service by telemedicine and the availability of in-person appointments. I also discussed with the patient that there may be a patient responsible charge related to this service. The patient expressed understanding and agreed to proceed.   Other persons participating in the visit and their role in the encounter: Burgess Estelle, RN- checking in patient & navigation  Patient's location: imaging center  Provider's location: clinic  Chief Complaint: Low Dose CT Screening  Patient agreed to evaluation by telemedicine to discuss shared decision making for consideration of low dose CT lung cancer screening.    In accordance with CMS guidelines, patient has met eligibility criteria including age, absence of signs or symptoms of lung cancer.  Social History   Tobacco Use  . Smoking status: Current Some Day Smoker    Packs/day: 1.00    Years: 35.00    Pack years: 35.00    Types: Cigarettes  . Smokeless tobacco: Never Used  . Tobacco comment: about 3 cigarettes a day  Substance Use Topics  . Alcohol use: Yes    Alcohol/week: 0.0 standard drinks    Comment: weekend socially     A shared decision-making session was conducted prior to the performance of CT scan. This includes one or more decision aids, includes benefits and harms of screening, follow-up diagnostic testing, over-diagnosis, false positive rate, and total radiation exposure.   Counseling on the importance of adherence to annual lung cancer LDCT screening, impact of co-morbidities, and ability or willingness to undergo diagnosis and treatment is imperative for compliance of the program.    Counseling on the importance of continued smoking cessation for former smokers; the importance of smoking cessation for current smokers, and information about tobacco cessation interventions have been given to patient including [place] and [employer] [place] programs.   Written order for lung cancer screening with LDCT has been given to the patient and any and all questions have been answered to the best of my abilities.    Yearly follow up will be coordinated by Burgess Estelle, Thoracic Navigator.  I discussed the assessment and treatment plan with the patient. The patient was provided an opportunity to ask questions and all were answered. The patient agreed with the plan and demonstrated an understanding of the instructions.   The patient was advised to call back or seek an in-person evaluation if the symptoms worsen or if the condition fails to improve as anticipated.   I provided 15 minutes of face-to-face video visit time during this encounter, and > 50% was spent counseling as documented under my assessment & plan.   Beckey Rutter, DNP, AGNP-C [place] [facility] [phone] (work cell) [phone] (office)

## 2019-06-30 ENCOUNTER — Encounter: Payer: Self-pay | Admitting: *Deleted

## 2019-08-23 ENCOUNTER — Other Ambulatory Visit: Payer: Self-pay | Admitting: Primary Care

## 2019-08-23 DIAGNOSIS — I10 Essential (primary) hypertension: Secondary | ICD-10-CM

## 2019-08-23 DIAGNOSIS — E785 Hyperlipidemia, unspecified: Secondary | ICD-10-CM

## 2019-11-17 ENCOUNTER — Other Ambulatory Visit: Payer: Self-pay | Admitting: Primary Care

## 2019-11-17 DIAGNOSIS — E785 Hyperlipidemia, unspecified: Secondary | ICD-10-CM

## 2019-11-17 DIAGNOSIS — I10 Essential (primary) hypertension: Secondary | ICD-10-CM

## 2020-01-09 IMAGING — CT CT CHEST LUNG CANCER SCREENING LOW DOSE W/O CM
2 of 5 series · 15 of 40 positions shown, 18 images · non-contrast
Comparison: No priors.

CLINICAL DATA: 73-year-old male current smoker with 35 pack-year
history of smoking. Lung cancer screening examination.

EXAM:
CT CHEST WITHOUT CONTRAST LOW-DOSE FOR LUNG CANCER SCREENING
TECHNIQUE: Multidetector CT imaging of the chest was performed following the
standard protocol without IV contrast.

[Series 3: lung · axial · 0.68mm/px · z∈[-1207,-901]mm · 12 of 338 slices shown, 15 images (1 of 2)]
[im 16/338  mediastinal]
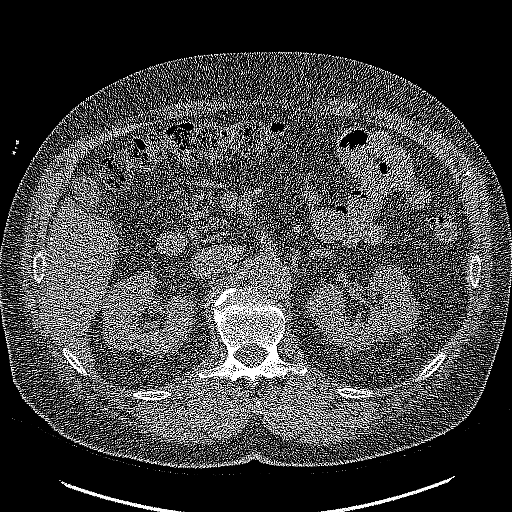
[im 16/338  lung]
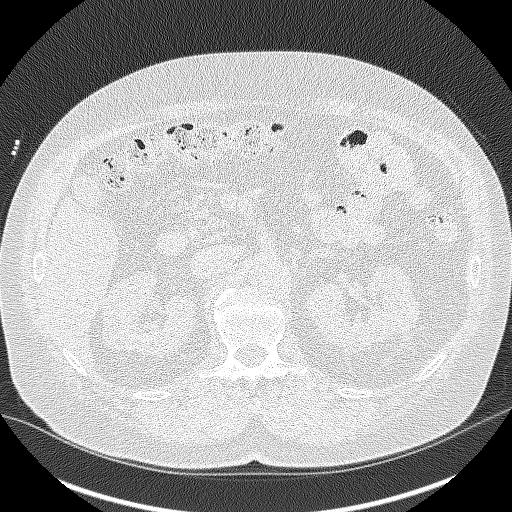
[im 46/338  lung]
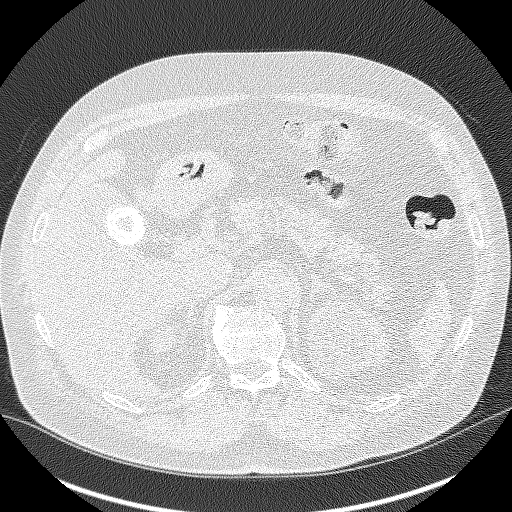
[im 77/338  lung]
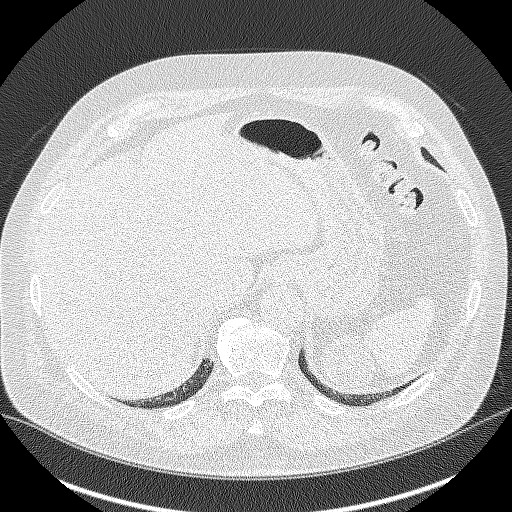
[im 108/338  lung]
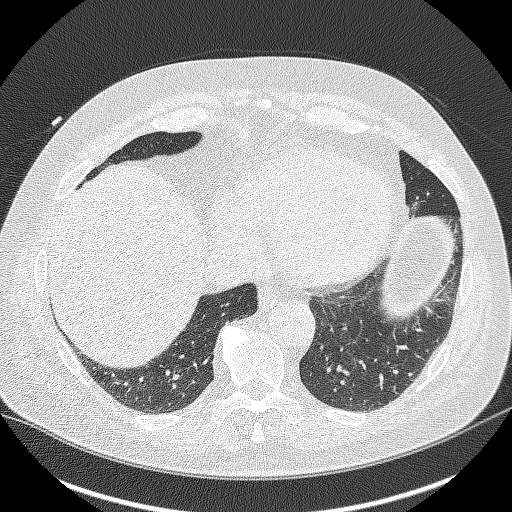
[im 123/338  mediastinal]
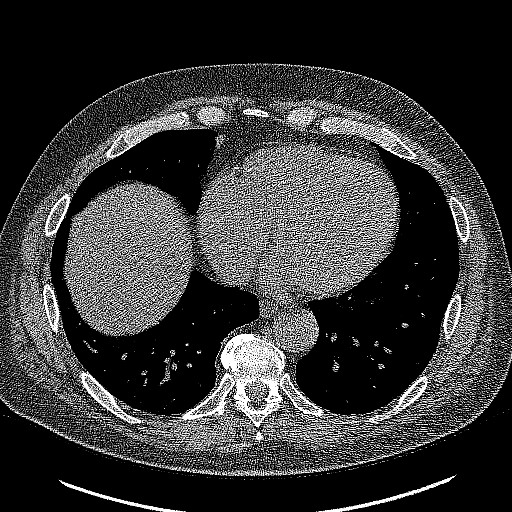
[im 123/338  lung]
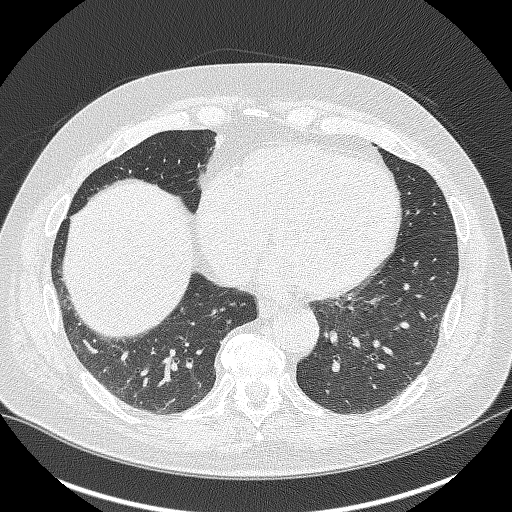
[im 154/338  lung]
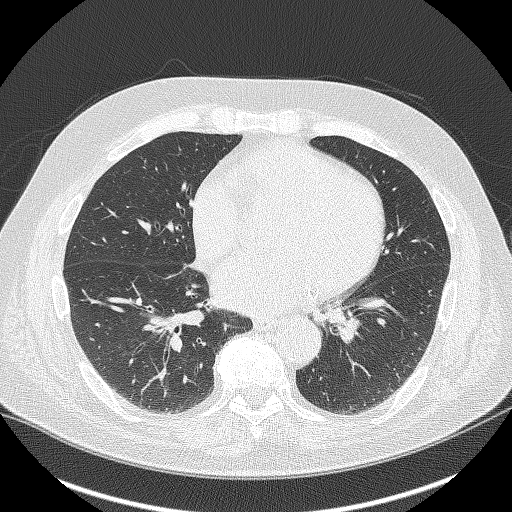
[im 184/338  lung]
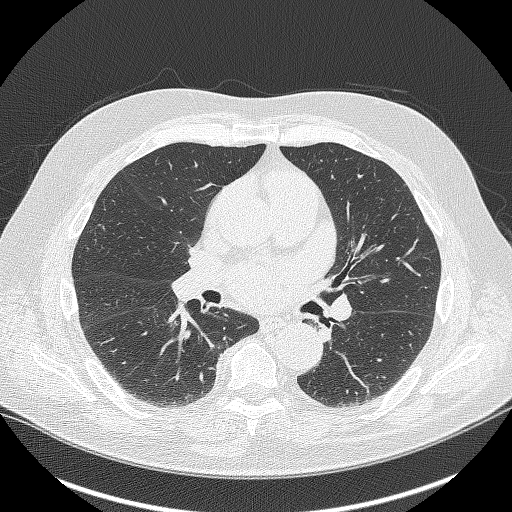
[im 215/338  lung]
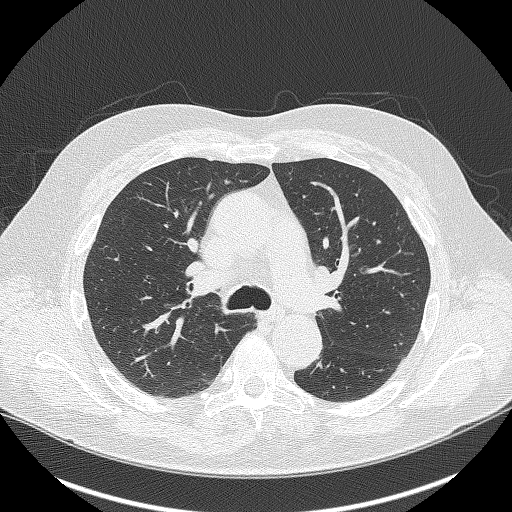
[im 230/338  mediastinal]
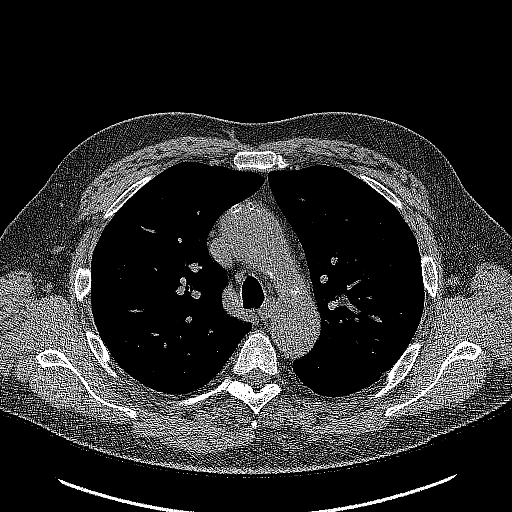
[im 230/338  lung]
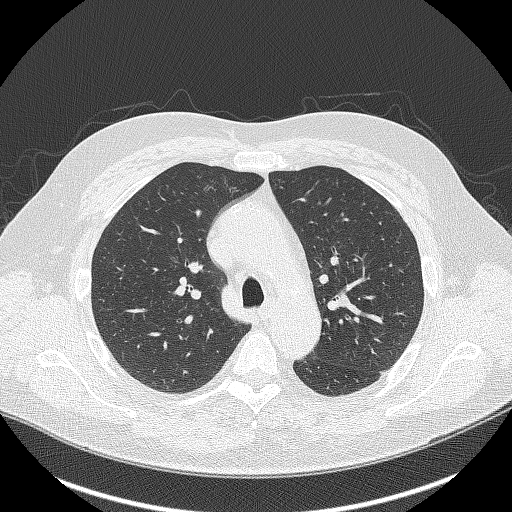
[im 261/338  lung]
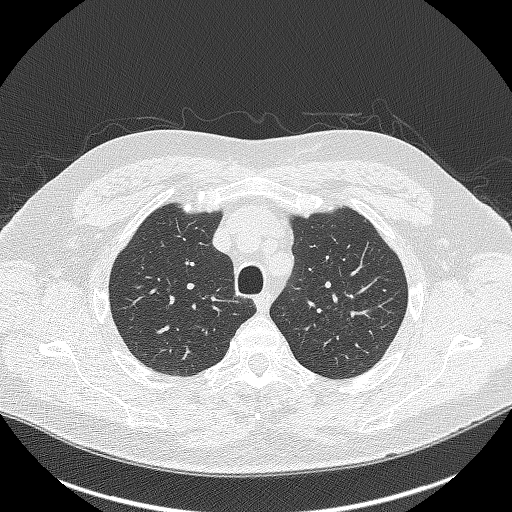
[im 292/338  lung]
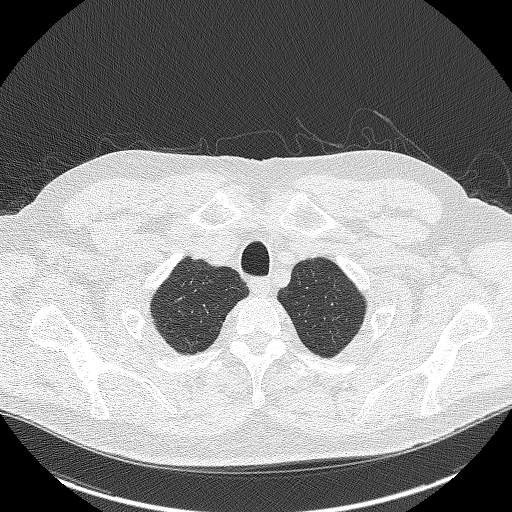
[im 322/338  lung]
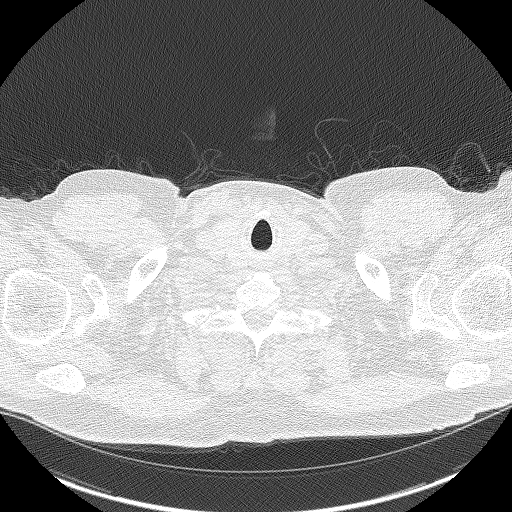

[Series 4: lung · coronal · 0.66mm/px · 3 of 293 slices shown (2 of 2)]
[im 59/293  lung]
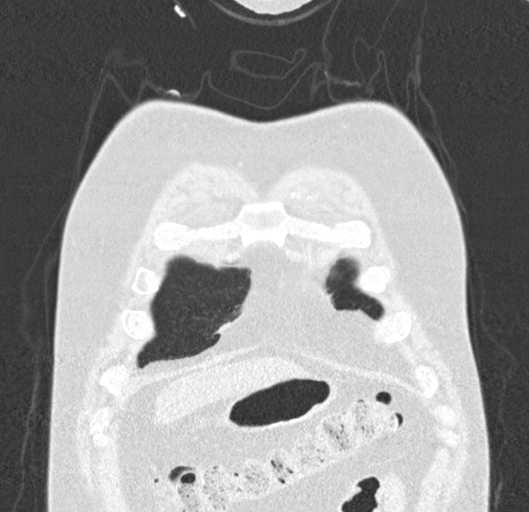
[im 117/293  lung]
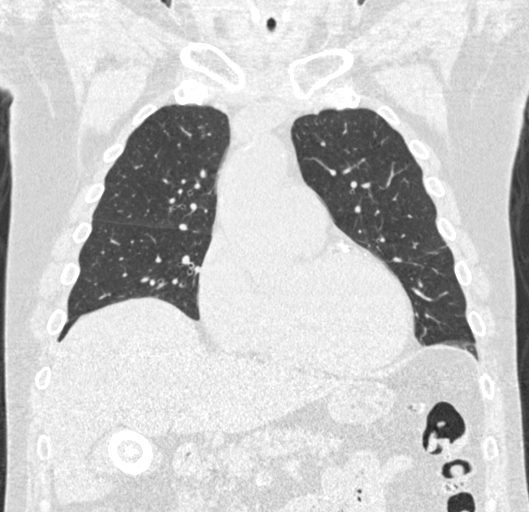
[im 176/293  lung]
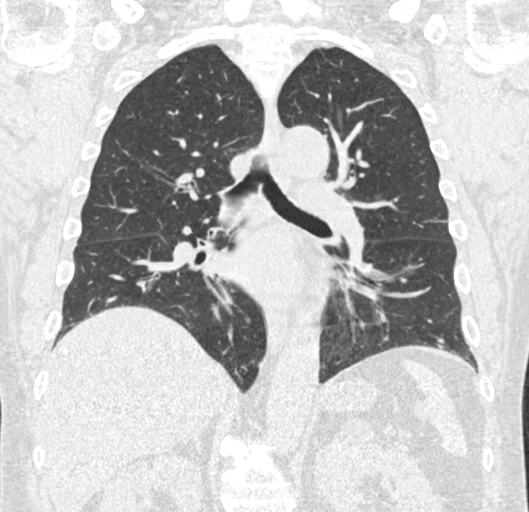

[15 of 40 positions shown; findings below may reference images not displayed]

FINDINGS: Cardiovascular: Heart size is normal. There is no significant
pericardial fluid, thickening or pericardial calcification. There is
aortic atherosclerosis, as well as atherosclerosis of the great
vessels of the mediastinum and the coronary arteries, including
calcified atherosclerotic plaque in the left main, left anterior
descending, left circumflex and right coronary arteries.

Mediastinum/Nodes: No pathologically enlarged mediastinal or hilar
lymph nodes. Please note that accurate exclusion of hilar adenopathy
is limited on noncontrast CT scans. Esophagus is unremarkable in
appearance. No axillary lymphadenopathy.

Lungs/Pleura: No suspicious appearing pulmonary nodules or masses
are noted. No acute consolidative airspace disease. No pleural
effusions. Mild diffuse bronchial wall thickening with very mild
centrilobular and paraseptal emphysema.

Upper Abdomen: Diffuse low attenuation throughout the visualized
hepatic parenchyma, indicative of a background of mild hepatic
steatosis. Large peripherally calcified gallstone measuring 2.8 cm
in diameter in the gallbladder. No surrounding inflammatory changes
to suggest an acute cholecystitis at this time. Exophytic 5.5 cm
low-attenuation lesion in the upper pole of the left kidney,
incompletely characterized on today's non-contrast CT examination,
but statistically likely to represent a cyst.

Musculoskeletal: There are no aggressive appearing lytic or blastic
lesions noted in the visualized portions of the skeleton.
IMPRESSION: 1. Lung-RADS 1S, negative. Continue annual screening with low-dose
chest CT without contrast in 12 months.
2. The "S" modifier above refers to potentially clinically
significant non lung cancer related findings. Specifically, there is
aortic atherosclerosis, in addition to left main and 3 vessel
coronary artery disease. Please note that although the presence of
coronary artery calcium documents the presence of coronary artery
disease, the severity of this disease and any potential stenosis
cannot be assessed on this non-gated CT examination. Assessment for
potential risk factor modification, dietary therapy or pharmacologic
therapy may be warranted, if clinically indicated.
3. Mild diffuse bronchial wall thickening with very mild
centrilobular and paraseptal emphysema; imaging findings suggestive
of underlying COPD.
4. Mild hepatic steatosis.
5. Cholelithiasis.

Aortic Atherosclerosis (ZISMD-R2Q.Q) and Emphysema (ZISMD-6VL.G).

## 2020-04-15 ENCOUNTER — Other Ambulatory Visit: Payer: Self-pay | Admitting: Primary Care

## 2020-04-15 DIAGNOSIS — I10 Essential (primary) hypertension: Secondary | ICD-10-CM

## 2020-06-05 ENCOUNTER — Telehealth: Payer: Self-pay | Admitting: *Deleted

## 2020-06-05 NOTE — Telephone Encounter (Signed)
Left a voicemail to inform patient that it is time to schedule his lung cancer screening scan. Instructed Bryan Calderon in message to call back to update information and get his CT scan scheduled.

## 2020-06-19 ENCOUNTER — Telehealth: Payer: Self-pay

## 2020-06-19 NOTE — Telephone Encounter (Signed)
Message left notifying patient that it is time to schedule the low dose lung cancer screening CT scan.  Instructed patient to return call to Shawn Perkins at [phone] to verify information prior to CT scan being scheduled.    

## 2020-07-14 ENCOUNTER — Telehealth: Payer: Self-pay | Admitting: *Deleted

## 2020-07-14 NOTE — Telephone Encounter (Signed)
Attempted to contact and schedule lung screening scan. Message left for patient to call back to schedule. 

## 2020-07-19 ENCOUNTER — Other Ambulatory Visit: Payer: Self-pay | Admitting: Primary Care

## 2020-07-19 DIAGNOSIS — R7303 Prediabetes: Secondary | ICD-10-CM

## 2020-07-19 DIAGNOSIS — I10 Essential (primary) hypertension: Secondary | ICD-10-CM

## 2020-07-19 DIAGNOSIS — Z125 Encounter for screening for malignant neoplasm of prostate: Secondary | ICD-10-CM

## 2020-07-19 DIAGNOSIS — E785 Hyperlipidemia, unspecified: Secondary | ICD-10-CM

## 2020-07-27 ENCOUNTER — Other Ambulatory Visit (INDEPENDENT_AMBULATORY_CARE_PROVIDER_SITE_OTHER): Payer: Federal, State, Local not specified - PPO

## 2020-07-27 ENCOUNTER — Other Ambulatory Visit: Payer: Self-pay

## 2020-07-27 DIAGNOSIS — E785 Hyperlipidemia, unspecified: Secondary | ICD-10-CM

## 2020-07-27 DIAGNOSIS — Z125 Encounter for screening for malignant neoplasm of prostate: Secondary | ICD-10-CM | POA: Diagnosis not present

## 2020-07-27 DIAGNOSIS — I10 Essential (primary) hypertension: Secondary | ICD-10-CM

## 2020-07-27 DIAGNOSIS — R7303 Prediabetes: Secondary | ICD-10-CM

## 2020-07-27 LAB — COMPREHENSIVE METABOLIC PANEL
ALT: 7 U/L (ref 0–53)
AST: 13 U/L (ref 0–37)
Albumin: 4.4 g/dL (ref 3.5–5.2)
Alkaline Phosphatase: 74 U/L (ref 39–117)
BUN: 21 mg/dL (ref 6–23)
CO2: 32 mEq/L (ref 19–32)
Calcium: 9.8 mg/dL (ref 8.4–10.5)
Chloride: 101 mEq/L (ref 96–112)
Creatinine, Ser: 0.95 mg/dL (ref 0.40–1.50)
GFR: 78.66 mL/min (ref >60.00)
Glucose, Bld: 110 mg/dL — ABNORMAL HIGH (ref 70–99)
Potassium: 3.8 mEq/L (ref 3.5–5.1)
Sodium: 139 mEq/L (ref 135–145)
Total Bilirubin: 0.6 mg/dL (ref 0.2–1.2)
Total Protein: 7.3 g/dL (ref 6.0–8.3)

## 2020-07-27 LAB — HEMOGLOBIN A1C: Hgb A1c MFr Bld: 5.9 % (ref 4.6–6.5)

## 2020-07-27 LAB — LIPID PANEL
Cholesterol: 175 mg/dL (ref 0–200)
HDL: 47.6 mg/dL (ref >39.00)
LDL Cholesterol: 115 mg/dL — ABNORMAL HIGH (ref 0–99)
NonHDL: 127.15
Total CHOL/HDL Ratio: 4
Triglycerides: 62 mg/dL (ref 0.0–149.0)
VLDL: 12.4 mg/dL (ref 0.0–40.0)

## 2020-07-27 LAB — PSA, MEDICARE: PSA: 2.83 ng/ml (ref 0.10–4.00)

## 2020-07-27 LAB — CBC
HCT: 42.6 % (ref 39.0–52.0)
Hemoglobin: 13.8 g/dL (ref 13.0–17.0)
MCHC: 32.3 g/dL (ref 30.0–36.0)
MCV: 81.7 fl (ref 78.0–100.0)
Platelets: 256 10*3/uL (ref 150.0–400.0)
RBC: 5.21 Mil/uL (ref 4.22–5.81)
RDW: 15.7 % — ABNORMAL HIGH (ref 11.5–15.5)
WBC: 12.4 10*3/uL — ABNORMAL HIGH (ref 4.0–10.5)

## 2020-07-28 ENCOUNTER — Other Ambulatory Visit: Payer: Self-pay

## 2020-07-28 ENCOUNTER — Encounter: Payer: Self-pay | Admitting: Family Medicine

## 2020-07-28 ENCOUNTER — Ambulatory Visit: Payer: Federal, State, Local not specified - PPO | Admitting: Family Medicine

## 2020-07-28 VITALS — BP 124/67 | HR 59 | Temp 97.1°F | Wt 199.2 lb

## 2020-07-28 DIAGNOSIS — L089 Local infection of the skin and subcutaneous tissue, unspecified: Secondary | ICD-10-CM

## 2020-07-28 DIAGNOSIS — L720 Epidermal cyst: Secondary | ICD-10-CM

## 2020-07-28 NOTE — Patient Instructions (Signed)
Ok to tylenol or ibuprofen for pain  Return in 2 days for wound check and packing check  If fever, chills, worsening redness, pain, swelling - call the clinic  You may need a referral to a surgeon to completely remove the cyst lesion as it may return.

## 2020-07-28 NOTE — Progress Notes (Signed)
Subjective:     Bryan Calderon is a 74 y.o. male presenting for Mass (center of back - painful and red x 2 weeks )     HPI   #Mass  - wife drained it a long time ago - symptoms started 3 weeks - small lump present all the time - swelling, pain, redness for 2-3 weeks - no fever/chills   Review of Systems   Social History   Tobacco Use  Smoking Status Current Some Day Smoker  . Packs/day: 1.00  . Years: 35.00  . Pack years: 35.00  . Types: Cigarettes  Smokeless Tobacco Never Used  Tobacco Comment   about 3 cigarettes a day        Objective:    BP Readings from Last 3 Encounters:  07/28/20 124/67  05/23/19 126/76  04/24/18 122/66   Wt Readings from Last 3 Encounters:  07/28/20 199 lb 4 oz (90.4 kg)  06/26/19 208 lb (94.3 kg)  05/23/19 209 lb 8 oz (95 kg)    BP 124/67   Pulse (!) 59   Temp (!) 97.1 F (36.2 C) (Temporal)   Wt 199 lb 4 oz (90.4 kg)   SpO2 97%   BMI 29.42 kg/m    Physical Exam Constitutional:      Appearance: Normal appearance. He is not ill-appearing or diaphoretic.  HENT:     Right Ear: External ear normal.     Left Ear: External ear normal.     Nose: Nose normal.  Eyes:     General: No scleral icterus.    Extraocular Movements: Extraocular movements intact.     Conjunctiva/sclera: Conjunctivae normal.  Cardiovascular:     Rate and Rhythm: Normal rate.  Pulmonary:     Effort: Pulmonary effort is normal.  Musculoskeletal:     Cervical back: Neck supple.     Comments: Back with inflamed sebaceous cyst 3.5 cm x 4 cm  Skin:    General: Skin is warm and dry.  Neurological:     Mental Status: He is alert. Mental status is at baseline.  Psychiatric:        Mood and Affect: Mood normal.           Assessment & Plan:   Problem List Items Addressed This Visit    None    Visit Diagnoses    Infected epidermoid cyst    -  Primary     Procedure: Incision and drainage  Meds, vitals, and allergies reviewed.    Indication: Infected epidermoid cyst  Pt complaints of: erythema, pain, swelling  Location: Back  Size: 3.5 cm x 4 cm  Informed consent obtained.  Pt aware of risks not limited to but including infection, bleeding, damage to near by organs.  Prep: iodine   Anesthesia: 1%lidocaine with epi, good effect  Incision made with #11 blade  Wound explored and loculations removed. Some induration still present. Initially purulent discharge followed by epidermoid cyst sections.   Wound packed with iodoform gauze  Tolerated well  Routine postprocedure instructions d/w pt- remove packing in 24-48h, keep area clean and bandaged, follow up if concerns/spreading erythema/pain.   Discussed returning for wound check and packing as well as risk for cyst returning and need for possible surgery consult for complete excision.    Return in about 2 days (around 07/30/2020).  Lesleigh Noe, MD  This visit occurred during the SARS-CoV-2 public health emergency.  Safety protocols were in place, including screening questions prior to the  visit, additional usage of staff PPE, and extensive cleaning of exam room while observing appropriate contact time as indicated for disinfecting solutions.

## 2020-07-30 ENCOUNTER — Ambulatory Visit: Payer: Federal, State, Local not specified - PPO | Admitting: Family Medicine

## 2020-07-30 ENCOUNTER — Encounter: Payer: Self-pay | Admitting: Family Medicine

## 2020-07-30 ENCOUNTER — Other Ambulatory Visit: Payer: Self-pay

## 2020-07-30 VITALS — BP 120/66 | HR 56 | Temp 96.4°F | Ht 69.0 in | Wt 201.0 lb

## 2020-07-30 DIAGNOSIS — L089 Local infection of the skin and subcutaneous tissue, unspecified: Secondary | ICD-10-CM

## 2020-07-30 DIAGNOSIS — L720 Epidermal cyst: Secondary | ICD-10-CM

## 2020-07-30 HISTORY — DX: Local infection of the skin and subcutaneous tissue, unspecified: L08.9

## 2020-07-30 HISTORY — DX: Epidermal cyst: L72.0

## 2020-07-30 MED ORDER — DOXYCYCLINE HYCLATE 100 MG PO TABS: 100.0000 mg | ORAL_TABLET | Freq: Two times a day (BID) | ORAL | 0 refills | Status: DC

## 2020-07-30 NOTE — Patient Instructions (Addendum)
Start warm compresses three times daily.  Complete antibiotics. Remove packing  Saturday 07/31/2020 AM.  We will call with wound culture results. If fever starts, redness spreading or pain increasing... go to Urgent Care or ER.

## 2020-07-30 NOTE — Assessment & Plan Note (Signed)
Placed new ribbon gauze.. to attempt to keep abscess draining as incision small. Wound culture obtained. Started on vancomycin to cover for MRSA as not healing with I and D alone. Instructed pt to perform frequent warm compresses and wash are to encourage wound to continue to drain.  Close follow up with PCP early next week.  Return precautions given.

## 2020-07-30 NOTE — Progress Notes (Signed)
Chief Complaint  Patient presents with  . Wound Check    History of Present Illness: HPI  74 year old male pt  of Allie Bossier with history of  CAD, HTN, COPD, and prediabetes presents  for follow up infected epidermoid cyst on central back.   He was seen on 07/28/2020 by Dr. Einar Pheasant. Size: 3.5 cm x 4 cm  Treated with I and D. No antibiotics given. No wound culture taken.   Today he reports  The gauze is still in place.  Less pain. Wife changed bandage today and there was minimal discharge. No fever, feels well overall.  Has not ha to use any pain medication in last 2 days.     This visit occurred during the SARS-CoV-2 public health emergency.  Safety protocols were in place, including screening questions prior to the visit, additional usage of staff PPE, and extensive cleaning of exam room while observing appropriate contact time as indicated for disinfecting solutions.   COVID 19 screen:  No recent travel or known exposure to COVID19 The patient denies respiratory symptoms of COVID 19 at this time. The importance of social distancing was discussed today.     Review of Systems  Constitutional: Negative for chills and fever.  HENT: Negative for congestion and ear pain.   Eyes: Negative for pain and redness.  Respiratory: Negative for cough and shortness of breath.   Cardiovascular: Negative for chest pain, palpitations and leg swelling.  Gastrointestinal: Negative for abdominal pain, blood in stool, constipation, diarrhea, nausea and vomiting.  Genitourinary: Negative for dysuria.  Musculoskeletal: Negative for falls and myalgias.  Skin: Negative for rash.  Neurological: Negative for dizziness.  Psychiatric/Behavioral: Negative for depression. The patient is not nervous/anxious.       Past Medical History:  Diagnosis Date  . Alcohol abuse   . Coronary artery disease 2015  . GERD (gastroesophageal reflux disease)   . Heart disease   . Hyperlipidemia   . Hypertension      reports that he has been smoking cigarettes. He has a 35.00 pack-year smoking history. He has never used smokeless tobacco. He reports current alcohol use. He reports that he does not use drugs.   Current Outpatient Medications:  .  atorvastatin (LIPITOR) 40 MG tablet, TAKE 1 TABLET (40 MG TOTAL) BY MOUTH AT BEDTIME. FOR CHOLESTEROL, Disp: 90 tablet, Rfl: 1 .  hydrochlorothiazide (HYDRODIURIL) 25 MG tablet, TAKE 1 TABLET BY MOUTH DAILY FOR SWELLING AND BLOOD PRESSURE, Disp: 90 tablet, Rfl: 0 .  lisinopril (ZESTRIL) 10 MG tablet, TAKE 1 TABLET (10 MG TOTAL) BY MOUTH DAILY. FOR BLOOD PRESSURE, Disp: 90 tablet, Rfl: 1 .  metoprolol tartrate (LOPRESSOR) 50 MG tablet, TAKE 1 TABLET BY MOUTH TWICE A DAY, Disp: 180 tablet, Rfl: 1 .  nitroGLYCERIN (NITROSTAT) 0.4 MG SL tablet, TAKE 1 TABLET EVERY 5 MINS up to three doses as needed for chest pain., Disp: 30 tablet, Rfl: 0 .  vitamin E 400 UNIT capsule, Take 400 Units by mouth. Taking 3 times a week, Disp: , Rfl:    Observations/Objective: Blood pressure 120/66, pulse (!) 56, temperature (!) 96.4 F (35.8 C), temperature source Temporal, height 5\' 9"  (1.753 m), weight 201 lb (91.2 kg), SpO2 97 %.  Physical Exam Constitutional:      Appearance: He is well-developed.  HENT:     Head: Normocephalic.     Right Ear: Hearing normal.     Left Ear: Hearing normal.     Nose: Nose normal.  Neck:  Thyroid: No thyroid mass or thyromegaly.     Vascular: No carotid bruit.     Trachea: Trachea normal.  Cardiovascular:     Rate and Rhythm: Normal rate and regular rhythm.     Pulses: Normal pulses.     Heart sounds: Heart sounds not distant. No murmur heard.  No friction rub. No gallop.      Comments: No peripheral edema Pulmonary:     Effort: Pulmonary effort is normal. No respiratory distress.     Breath sounds: Normal breath sounds.  Skin:    General: Skin is warm and dry.     Findings: No rash.     Comments: Central back: 0.75 cm incision from  I and D in middle of raised 4 cm diameter oblong swelling, no drainage occurring given small inscision.. removed gauze (6 cm inch strand), with it came copious pus.  Wound culture obtained. Area pressed to express more pus and sebaceous material.  12 cm linear new gauze replaced in wound.  Minimal bleeding and pt tolerated procedure.   Psychiatric:        Speech: Speech normal.        Behavior: Behavior normal.        Thought Content: Thought content normal.      Assessment and Plan   Infected epidermoid cyst  Placed new ribbon gauze.. to attempt to keep abscess draining as incision small. Wound culture obtained. Started on vancomycin to cover for MRSA as not healing with I and D alone. Instructed pt to perform frequent warm compresses and wash are to encourage wound to continue to drain.  Close follow up with PCP early next week.  Return precautions given.     Eliezer Lofts, MD

## 2020-08-02 LAB — WOUND CULTURE
MICRO NUMBER:: 11107371
SPECIMEN QUALITY:: ADEQUATE

## 2020-08-03 ENCOUNTER — Encounter: Payer: Self-pay | Admitting: Primary Care

## 2020-08-03 ENCOUNTER — Other Ambulatory Visit: Payer: Self-pay

## 2020-08-03 ENCOUNTER — Ambulatory Visit (INDEPENDENT_AMBULATORY_CARE_PROVIDER_SITE_OTHER): Payer: Federal, State, Local not specified - PPO | Admitting: Primary Care

## 2020-08-03 VITALS — BP 118/62 | HR 55 | Temp 97.6°F | Ht 69.0 in | Wt 200.0 lb

## 2020-08-03 DIAGNOSIS — Z23 Encounter for immunization: Secondary | ICD-10-CM | POA: Diagnosis not present

## 2020-08-03 DIAGNOSIS — Z122 Encounter for screening for malignant neoplasm of respiratory organs: Secondary | ICD-10-CM

## 2020-08-03 DIAGNOSIS — Z1211 Encounter for screening for malignant neoplasm of colon: Secondary | ICD-10-CM

## 2020-08-03 DIAGNOSIS — R7303 Prediabetes: Secondary | ICD-10-CM

## 2020-08-03 DIAGNOSIS — E785 Hyperlipidemia, unspecified: Secondary | ICD-10-CM

## 2020-08-03 DIAGNOSIS — J432 Centrilobular emphysema: Secondary | ICD-10-CM

## 2020-08-03 DIAGNOSIS — I10 Essential (primary) hypertension: Secondary | ICD-10-CM

## 2020-08-03 DIAGNOSIS — Z0000 Encounter for general adult medical examination without abnormal findings: Secondary | ICD-10-CM | POA: Diagnosis not present

## 2020-08-03 DIAGNOSIS — F338 Other recurrent depressive disorders: Secondary | ICD-10-CM

## 2020-08-03 DIAGNOSIS — L720 Epidermal cyst: Secondary | ICD-10-CM

## 2020-08-03 DIAGNOSIS — L089 Local infection of the skin and subcutaneous tissue, unspecified: Secondary | ICD-10-CM

## 2020-08-03 DIAGNOSIS — I2510 Atherosclerotic heart disease of native coronary artery without angina pectoris: Secondary | ICD-10-CM

## 2020-08-03 MED ORDER — ATORVASTATIN CALCIUM 80 MG PO TABS: 80.0000 mg | ORAL_TABLET | Freq: Every day | ORAL | 3 refills | Status: DC

## 2020-08-03 NOTE — Assessment & Plan Note (Signed)
Overall stable on recent labs. Continue to monitor.

## 2020-08-03 NOTE — Patient Instructions (Addendum)
We increased the dose of your atorvastatin medication for cholesterol from 40 mg to 80 mg. You may take two of the 40 mg pills to equal 80 mg until your bottle is empty. Only take one of the 80 mg tablets when you pick this up from the pharmacy.  You will be contacted regarding your referral to GI for the colonoscopy and for lung cancer screening.  Please let us know if you have not been contacted within two weeks.   Start exercising. You should be getting 150 minutes of moderate intensity exercise weekly.  It's important to improve your diet by reducing consumption of fast food, fried food, processed snack foods, sugary drinks. Increase consumption of fresh vegetables and fruits, whole grains, water.  Ensure you are drinking 64 ounces of water daily.  It was a pleasure to see you today!   Preventive Care 40 Years and Older, Male Preventive care refers to lifestyle choices and visits with your health care provider that can promote health and wellness. This includes:  A yearly physical exam. This is also called an annual well check.  Regular dental and eye exams.  Immunizations.  Screening for certain conditions.  Healthy lifestyle choices, such as diet and exercise. What can I expect for my preventive care visit? Physical exam Your health care provider will check:  Height and weight. These may be used to calculate body mass index (BMI), which is a measurement that tells if you are at a healthy weight.  Heart rate and blood pressure.  Your skin for abnormal spots. Counseling Your health care provider may ask you questions about:  Alcohol, tobacco, and drug use.  Emotional well-being.  Home and relationship well-being.  Sexual activity.  Eating habits.  History of falls.  Memory and ability to understand (cognition).  Work and work Statistician. What immunizations do I need?  Influenza (flu) vaccine  This is recommended every year. Tetanus, diphtheria, and  pertussis (Tdap) vaccine  You may need a Td booster every 10 years. Varicella (chickenpox) vaccine  You may need this vaccine if you have not already been vaccinated. Zoster (shingles) vaccine  You may need this after age 46. Pneumococcal conjugate (PCV13) vaccine  One dose is recommended after age 73. Pneumococcal polysaccharide (PPSV23) vaccine  One dose is recommended after age 3. Measles, mumps, and rubella (MMR) vaccine  You may need at least one dose of MMR if you were born in 1957 or later. You may also need a second dose. Meningococcal conjugate (MenACWY) vaccine  You may need this if you have certain conditions. Hepatitis A vaccine  You may need this if you have certain conditions or if you travel or work in places where you may be exposed to hepatitis A. Hepatitis B vaccine  You may need this if you have certain conditions or if you travel or work in places where you may be exposed to hepatitis B. Haemophilus influenzae type b (Hib) vaccine  You may need this if you have certain conditions. You may receive vaccines as individual doses or as more than one vaccine together in one shot (combination vaccines). Talk with your health care provider about the risks and benefits of combination vaccines. What tests do I need? Blood tests  Lipid and cholesterol levels. These may be checked every 5 years, or more frequently depending on your overall health.  Hepatitis C test.  Hepatitis B test. Screening  Lung cancer screening. You may have this screening every year starting at age 30 if  you have a 30-pack-year history of smoking and currently smoke or have quit within the past 15 years.  Colorectal cancer screening. All adults should have this screening starting at age 65 and continuing until age 36. Your health care provider may recommend screening at age 79 if you are at increased risk. You will have tests every 1-10 years, depending on your results and the type of  screening test.  Prostate cancer screening. Recommendations will vary depending on your family history and other risks.  Diabetes screening. This is done by checking your blood sugar (glucose) after you have not eaten for a while (fasting). You may have this done every 1-3 years.  Abdominal aortic aneurysm (AAA) screening. You may need this if you are a current or former smoker.  Sexually transmitted disease (STD) testing. Follow these instructions at home: Eating and drinking  Eat a diet that includes fresh fruits and vegetables, whole grains, lean protein, and low-fat dairy products. Limit your intake of foods with high amounts of sugar, saturated fats, and salt.  Take vitamin and mineral supplements as recommended by your health care provider.  Do not drink alcohol if your health care provider tells you not to drink.  If you drink alcohol: ? Limit how much you have to 0-2 drinks a day. ? Be aware of how much alcohol is in your drink. In the [place] one drink equals one 12 oz bottle of beer (355 mL), one 5 oz glass of wine (148 mL), or one 1 oz glass of hard liquor (44 mL). Lifestyle  Take daily care of your teeth and gums.  Stay active. Exercise for at least 30 minutes on 5 or more days each week.  Do not use any products that contain nicotine or tobacco, such as cigarettes, e-cigarettes, and chewing tobacco. If you need help quitting, ask your health care provider.  If you are sexually active, practice safe sex. Use a condom or other form of protection to prevent STIs (sexually transmitted infections).  Talk with your health care provider about taking a low-dose aspirin or statin. What's next?  Visit your health care provider once a year for a well check visit.  Ask your health care provider how often you should have your eyes and teeth checked.  Stay up to date on all vaccines. This information is not intended to replace advice given to you by your health care provider.  Make sure you discuss any questions you have with your health care provider. Document Revised: 09/19/2018 Document Reviewed: 09/19/2018 Elsevier Patient Education  [place].     Influenza (Flu) Vaccine (Inactivated or Recombinant): What You Need to Know 1. Why get vaccinated? Influenza vaccine can prevent influenza (flu). Flu is a contagious disease that spreads around the [place] every year, usually between October and May. Anyone can get the flu, but it is more dangerous for some people. Infants and young children, people 7 years of age and older, pregnant women, and people with certain health conditions or a weakened immune system are at greatest risk of flu complications. Pneumonia, bronchitis, sinus infections and ear infections are examples of flu-related complications. If you have a medical condition, such as heart disease, cancer or diabetes, flu can make it worse. Flu can cause fever and chills, sore throat, muscle aches, fatigue, cough, headache, and runny or stuffy nose. Some people may have vomiting and diarrhea, though this is more common in children than adults. Each year thousands of people in the [place]  die from flu, and many more are hospitalized. Flu vaccine prevents millions of illnesses and flu-related visits to the doctor each year. 2. Influenza vaccine CDC recommends everyone 84 months of age and older get vaccinated every flu season. Children 6 months through 15 years of age may need 2 doses during a single flu season. Everyone else needs only 1 dose each flu season. It takes about 2 weeks for protection to develop after vaccination. There are many flu viruses, and they are always changing. Each year a new flu vaccine is made to protect against three or four viruses that are likely to cause disease in the upcoming flu season. Even when the vaccine doesn't exactly match these viruses, it may still provide some protection. Influenza vaccine does not cause  flu. Influenza vaccine may be given at the same time as other vaccines. 3. Talk with your health care provider Tell your vaccine provider if the person getting the vaccine:  Has had an allergic reaction after a previous dose of influenza vaccine, or has any severe, life-threatening allergies.  Has ever had Guillain-Barr Syndrome (also called GBS). In some cases, your health care provider may decide to postpone influenza vaccination to a future visit. People with minor illnesses, such as a cold, may be vaccinated. People who are moderately or severely ill should usually wait until they recover before getting influenza vaccine. Your health care provider can give you more information. 4. Risks of a vaccine reaction  Soreness, redness, and swelling where shot is given, fever, muscle aches, and headache can happen after influenza vaccine.  There may be a very small increased risk of Guillain-Barr Syndrome (GBS) after inactivated influenza vaccine (the flu shot). Young children who get the flu shot along with pneumococcal vaccine (PCV13), and/or DTaP vaccine at the same time might be slightly more likely to have a seizure caused by fever. Tell your health care provider if a child who is getting flu vaccine has ever had a seizure. People sometimes faint after medical procedures, including vaccination. Tell your provider if you feel dizzy or have vision changes or ringing in the ears. As with any medicine, there is a very remote chance of a vaccine causing a severe allergic reaction, other serious injury, or death. 5. What if there is a serious problem? An allergic reaction could occur after the vaccinated person leaves the clinic. If you see signs of a severe allergic reaction (hives, swelling of the face and throat, difficulty breathing, a fast heartbeat, dizziness, or weakness), call 9-1-1 and get the person to the nearest hospital. For other signs that concern you, call your health care  provider. Adverse reactions should be reported to the Vaccine Adverse Event Reporting System (VAERS). Your health care provider will usually file this report, or you can do it yourself. Visit the VAERS website at www.vaers.SamedayNews.es or call 602-617-4455.VAERS is only for reporting reactions, and VAERS staff do not give medical advice. 6. The National Vaccine Injury Compensation Program The [employer] Vaccine Injury Compensation Program (VICP) is a federal program that was created to compensate people who may have been injured by certain vaccines. Visit the VICP website at GoldCloset.com.ee or call 717 647 5444 to learn about the program and about filing a claim. There is a time limit to file a claim for compensation. 7. How can I learn more?  Ask your healthcare provider.  Call your local or state health department.  Contact the Centers for Disease Control and Prevention (CDC): ? Call 561 243 9362 (1-800-CDC-INFO) or ? Visit  www.cdc.gov/flu Vaccine Information Statement (Interim) Inactivated Influenza Vaccine (05/23/2018) This information is not intended to replace advice given to you by your health care provider. Make sure you discuss any questions you have with your health care provider. Document Revised: 01/14/2019 Document Reviewed: 05/27/2018 Elsevier Patient Education  2020 Elsevier Inc.  

## 2020-08-03 NOTE — Assessment & Plan Note (Signed)
Influenza vaccination provided today. PSA UTD. Colonoscopy due, referral placed to GI. Lung cancer screening due, referral placed. Discussed the importance of a healthy diet and regular exercise in order for weight loss, and to reduce the risk of any potential medical problems.  Exam today stable. Labs reviewed.

## 2020-08-03 NOTE — Assessment & Plan Note (Signed)
Significant improvement and nearly healed today. Continue Doxycycline.

## 2020-08-03 NOTE — Assessment & Plan Note (Signed)
Denies concerns for depression and anxiety. Continue to monitor.

## 2020-08-03 NOTE — Assessment & Plan Note (Signed)
Asymptomatic.  Would like to see LDL lower than 115, especially with ASCVD risk score of 28%.  Increase atorvastatin to 80 mg.  Repeat lipids in 2-3 months.  Continue diabetes and blood pressure control.

## 2020-08-03 NOTE — Assessment & Plan Note (Signed)
Well controlled in the office today, continue lisinopril 10 mg and metoprolol tartrate 50 mg.

## 2020-08-03 NOTE — Assessment & Plan Note (Signed)
LDL above goal with ASCVD risk score of 28%. Increase atorvastatin to 80 mg. Repeat lipids in 2-3 months.

## 2020-08-03 NOTE — Assessment & Plan Note (Signed)
Stable, no use of albuterol inhaler. Referral placed to renew lung cancer screening.

## 2020-08-03 NOTE — Progress Notes (Signed)
Subjective:    [name], male    DOB: 12/02/1945, 74 y.o.   MRN: [id number]  HPI  This visit occurred during the SARS-CoV-2 public health emergency.  Safety protocols were in place, including screening questions prior to the visit, additional usage of staff PPE, and extensive cleaning of exam room while observing appropriate contact time as indicated for disinfecting solutions.   Bryan Calderon is a 74 year old male who presents today for complete physical.  Immunizations: -Tetanus: None on file -Influenza: Due today -Shingles: Completed series  -Pneumonia: Completed series, last dose in 2018 -Covid-19: Completed series   Diet:  He endorses a fair diet.  Exercise: He is active at home in the yard, plans on walking on his treadmill.  Eye exam: No recent exam Dental exam: Completes annually   Colonoscopy: Completed years ago in [place], thinks over 10 years. PSA: 2.83 Hep C Screen: Negative Lung Cancer Screening: Completed in September 2020  Wt Readings from Last 3 Encounters:  08/03/20 200 lb (90.7 kg)  07/30/20 201 lb (91.2 kg)  07/28/20 199 lb 4 oz (90.4 kg)   BP Readings from Last 3 Encounters:  08/03/20 118/62  07/30/20 120/66  07/28/20 124/67   The 10-year ASCVD risk score Mikey Bussing DC Jr., et al., 2013) is: 28.6%   Values used to calculate the score:     Age: 25 years     Sex: Male     Is Non-Hispanic African American: Yes     Diabetic: No     Tobacco smoker: Yes     Systolic Blood Pressure: 867 mmHg     Is BP treated: Yes     HDL Cholesterol: 47.6 mg/dL     Total Cholesterol: 175 mg/dL   Review of Systems  Constitutional: Negative for unexpected weight change.  HENT: Negative for rhinorrhea.   Eyes: Negative for visual disturbance.  Respiratory: Negative for cough.        Exertional dyspnea with moderate activity, improves with rest.  Cardiovascular: Negative for chest pain.  Gastrointestinal: Negative for constipation and diarrhea.    Genitourinary: Negative for difficulty urinating.  Musculoskeletal: Negative for arthralgias and myalgias.  Skin: Negative for rash.  Allergic/Immunologic: Negative for environmental allergies.  Neurological: Negative for dizziness, numbness and headaches.  Psychiatric/Behavioral: The patient is not nervous/anxious.        Past Medical History:  Diagnosis Date  . Alcohol abuse   . Coronary artery disease 2015  . GERD (gastroesophageal reflux disease)   . Heart disease   . Hyperlipidemia   . Hypertension      Social History   Socioeconomic History  . Marital status: Married    Spouse name: Not on file  . Number of children: Not on file  . Years of education: Not on file  . Highest education level: Not on file  Occupational History  . Not on file  Tobacco Use  . Smoking status: Current Some Day Smoker    Packs/day: 1.00    Years: 35.00    Pack years: 35.00    Types: Cigarettes  . Smokeless tobacco: Never Used  . Tobacco comment: about 3 cigarettes a day  Substance and Sexual Activity  . Alcohol use: Yes    Alcohol/week: 0.0 standard drinks    Comment: weekend socially  . Drug use: No  . Sexual activity: Not on file  Other Topics Concern  . Not on file  Social History Narrative   Married.   8  children   Retired. Looking for part time work.   Enjoys fishing, sports, spending time with family.   Social Determinants of Health   Financial Resource Strain:   . Difficulty of Paying Living Expenses: Not on file  Food Insecurity:   . Worried About Charity fundraiser in the Last Year: Not on file  . Ran Out of Food in the Last Year: Not on file  Transportation Needs:   . Lack of Transportation (Medical): Not on file  . Lack of Transportation (Non-Medical): Not on file  Physical Activity:   . Days of Exercise per Week: Not on file  . Minutes of Exercise per Session: Not on file  Stress:   . Feeling of Stress : Not on file  Social Connections:   . Frequency of  Communication with Friends and Family: Not on file  . Frequency of Social Gatherings with Friends and Family: Not on file  . Attends Religious Services: Not on file  . Active Member of Clubs or Organizations: Not on file  . Attends Archivist Meetings: Not on file  . Marital Status: Not on file  Intimate Partner Violence:   . Fear of Current or Ex-Partner: Not on file  . Emotionally Abused: Not on file  . Physically Abused: Not on file  . Sexually Abused: Not on file    Past Surgical History:  Procedure Laterality Date  . APPENDECTOMY    . CARDIAC CATHETERIZATION  08/03/2014  . CARDIAC CATHETERIZATION  [place], [place]   . CAROTID STENT  2015 and 2004  . CORONARY ANGIOPLASTY WITH STENT PLACEMENT  08/03/2014  . CORONARY ANGIOPLASTY WITH STENT PLACEMENT  2005   [place], [place]   . SHOULDER SURGERY Bilateral    Rotator Cuff Repair    Family History  Problem Relation Age of Onset  . Heart disease Maternal Grandmother   . Heart attack Maternal Grandmother     No Known Allergies  Current Outpatient Medications on File Prior to Visit  Medication Sig Dispense Refill  . doxycycline (VIBRA-TABS) 100 MG tablet Take 1 tablet (100 mg total) by mouth 2 (two) times daily. 20 tablet 0  . hydrochlorothiazide (HYDRODIURIL) 25 MG tablet TAKE 1 TABLET BY MOUTH DAILY FOR SWELLING AND BLOOD PRESSURE 90 tablet 0  . lisinopril (ZESTRIL) 10 MG tablet TAKE 1 TABLET (10 MG TOTAL) BY MOUTH DAILY. FOR BLOOD PRESSURE 90 tablet 1  . metoprolol tartrate (LOPRESSOR) 50 MG tablet TAKE 1 TABLET BY MOUTH TWICE A DAY 180 tablet 1  . nitroGLYCERIN (NITROSTAT) 0.4 MG SL tablet TAKE 1 TABLET EVERY 5 MINS up to three doses as needed for chest pain. 30 tablet 0  . vitamin E 400 UNIT capsule Take 400 Units by mouth. Taking 3 times a week     No current facility-administered medications on file prior to visit.    BP 118/62   Pulse (!) 55   Temp 97.6 F (36.4 C) (Temporal)   Ht 5\' 9"  (1.753 m)   Wt  200 lb (90.7 kg)   SpO2 98%   BMI 29.53 kg/m    Objective:   Physical Exam HENT:     Right Ear: Tympanic membrane and ear canal normal.     Left Ear: Tympanic membrane and ear canal normal.  Eyes:     Pupils: Pupils are equal, round, and reactive to light.  Cardiovascular:     Rate and Rhythm: Normal rate and regular rhythm.  Pulmonary:  Effort: Pulmonary effort is normal.     Breath sounds: Normal breath sounds.  Abdominal:     General: Bowel sounds are normal.     Palpations: Abdomen is soft.     Tenderness: There is no abdominal tenderness.  Musculoskeletal:        General: Normal range of motion.     Cervical back: Neck supple.  Skin:    General: Skin is warm and dry.     Comments: Well healing abscess, no erythema or drainage. Scabbing noted.  Neurological:     Mental Status: He is alert and oriented to person, place, and time.     Cranial Nerves: No cranial nerve deficit.     Deep Tendon Reflexes:     Reflex Scores:      Patellar reflexes are 2+ on the right side and 2+ on the left side. Psychiatric:        Mood and Affect: Mood normal.            Assessment & Plan:

## 2020-08-08 ENCOUNTER — Telehealth: Payer: Self-pay | Admitting: *Deleted

## 2020-08-08 NOTE — Telephone Encounter (Signed)
Left a voicemail to inform patient that it is time to schedule his lung cancer screening scan. Instructed patient to call back to update information and schedule scan.

## 2020-08-10 ENCOUNTER — Encounter: Payer: Self-pay | Admitting: Internal Medicine

## 2020-09-07 ENCOUNTER — Telehealth: Payer: Self-pay | Admitting: *Deleted

## 2020-09-07 NOTE — Telephone Encounter (Signed)
Attempted to contact and schedule lung screening scan. Message left for patient to call back to schedule. 

## 2020-09-09 ENCOUNTER — Telehealth: Payer: Self-pay

## 2020-09-09 NOTE — Telephone Encounter (Signed)
Pt was NS for PV.  Called left message requesting call back by 5:00 today to r/s PV to avoid cancellation of procedure.

## 2020-09-09 NOTE — Telephone Encounter (Signed)
No return call by 5:00 pm.  NS letter sent.  Procedure cancelled.

## 2020-09-16 ENCOUNTER — Telehealth: Payer: Self-pay | Admitting: *Deleted

## 2020-09-16 NOTE — Telephone Encounter (Signed)
Attempted to contact and schedule lung screening scan. Message left for patient to call back to schedule. 

## 2020-10-04 ENCOUNTER — Other Ambulatory Visit: Payer: Self-pay | Admitting: Primary Care

## 2020-10-04 DIAGNOSIS — I10 Essential (primary) hypertension: Secondary | ICD-10-CM

## 2020-10-04 DIAGNOSIS — E785 Hyperlipidemia, unspecified: Secondary | ICD-10-CM

## 2020-10-06 ENCOUNTER — Encounter: Payer: Federal, State, Local not specified - PPO | Admitting: Internal Medicine

## 2020-10-06 NOTE — Telephone Encounter (Signed)
Pharmacy requests refill on: Lisinopril 10 mg & Metoprolol Tartrate 50 mg    LAST REFILL: 11/18/2019 LAST OV: 08/03/2020 NEXT OV: 10/14/20 PHARMACY: [id number] [place], [place]

## 2020-10-08 ENCOUNTER — Other Ambulatory Visit: Payer: Self-pay | Admitting: Primary Care

## 2020-10-08 DIAGNOSIS — E785 Hyperlipidemia, unspecified: Secondary | ICD-10-CM

## 2020-10-13 ENCOUNTER — Encounter: Payer: Self-pay | Admitting: *Deleted

## 2020-10-14 ENCOUNTER — Other Ambulatory Visit: Payer: Self-pay | Admitting: Primary Care

## 2020-10-14 ENCOUNTER — Other Ambulatory Visit: Payer: Self-pay

## 2020-10-14 ENCOUNTER — Encounter: Payer: Self-pay | Admitting: Primary Care

## 2020-10-14 ENCOUNTER — Ambulatory Visit: Payer: Federal, State, Local not specified - PPO | Admitting: Primary Care

## 2020-10-14 VITALS — BP 132/82 | HR 52 | Temp 98.4°F | Ht 68.0 in | Wt 209.4 lb

## 2020-10-14 DIAGNOSIS — Z1211 Encounter for screening for malignant neoplasm of colon: Secondary | ICD-10-CM | POA: Diagnosis not present

## 2020-10-14 DIAGNOSIS — I2510 Atherosclerotic heart disease of native coronary artery without angina pectoris: Secondary | ICD-10-CM | POA: Diagnosis not present

## 2020-10-14 DIAGNOSIS — I10 Essential (primary) hypertension: Secondary | ICD-10-CM

## 2020-10-14 DIAGNOSIS — Z87891 Personal history of nicotine dependence: Secondary | ICD-10-CM

## 2020-10-14 DIAGNOSIS — E785 Hyperlipidemia, unspecified: Secondary | ICD-10-CM

## 2020-10-14 DIAGNOSIS — Z122 Encounter for screening for malignant neoplasm of respiratory organs: Secondary | ICD-10-CM | POA: Diagnosis not present

## 2020-10-14 LAB — LIPID PANEL
Cholesterol: 153 mg/dL (ref 0–200)
HDL: 51.6 mg/dL (ref >39.00)
LDL Cholesterol: 79 mg/dL (ref 0–99)
NonHDL: 101.01
Total CHOL/HDL Ratio: 3
Triglycerides: 109 mg/dL (ref 0.0–149.0)
VLDL: 21.8 mg/dL (ref 0.0–40.0)

## 2020-10-14 NOTE — Progress Notes (Signed)
Subjective:    [name], male    DOB: 04-01-1946, 75 y.o.   MRN: [id number]  HPI  This visit occurred during the SARS-CoV-2 public health emergency.  Safety protocols were in place, including screening questions prior to the visit, additional usage of staff PPE, and extensive cleaning of exam room while observing appropriate contact time as indicated for disinfecting solutions.   Mr. Bryan Calderon is a 75 year old male with a history of CAD, COPD, hypertension, hyperlipidemia, prediabetes who presents today for repeat lipid panel.  He is currently managed on atorvastatin 80 mg which was increased from 40 mg to 80 mg a few months ago due to LDL reading of 115.  He has a history of CAD with stent placement.  LDL goal is less than 70.  He denies chest pain, dizziness, shortness of breath with mild activity.  His colonoscopy procedure was canceled due to him being out of town, he is needing a new referral.  Also he never heard from lung cancer screening program for repeat CT chest.     BP Readings from Last 3 Encounters:  10/14/20 132/82  08/03/20 118/62  07/30/20 120/66     Review of Systems  Respiratory: Negative for shortness of breath.   Cardiovascular: Negative for chest pain.  Neurological: Negative for dizziness and headaches.     Past Medical History:  Diagnosis Date  . Alcohol abuse   . Coronary artery disease 2015  . GERD (gastroesophageal reflux disease)   . Heart disease   . Hyperlipidemia   . Hypertension      Social History   Socioeconomic History  . Marital status: Married    Spouse name: Not on file  . Number of children: Not on file  . Years of education: Not on file  . Highest education level: Not on file  Occupational History  . Not on file  Tobacco Use  . Smoking status: Current Some Day Smoker    Packs/day: 1.00    Years: 35.00    Pack years: 35.00    Types: Cigarettes  . Smokeless tobacco: Never Used  . Tobacco comment: about  3 cigarettes a day  Substance and Sexual Activity  . Alcohol use: Yes    Alcohol/week: 0.0 standard drinks    Comment: weekend socially  . Drug use: No  . Sexual activity: Not on file  Other Topics Concern  . Not on file  Social History Narrative   Married.   8 children   Retired. Looking for part time work.   Enjoys fishing, sports, spending time with family.   Social Determinants of Health   Financial Resource Strain: Not on file  Food Insecurity: Not on file  Transportation Needs: Not on file  Physical Activity: Not on file  Stress: Not on file  Social Connections: Not on file  Intimate Partner Violence: Not on file    Past Surgical History:  Procedure Laterality Date  . APPENDECTOMY    . CARDIAC CATHETERIZATION  08/03/2014  . CARDIAC CATHETERIZATION  [place], [place]   . CAROTID STENT  2015 and 2004  . CORONARY ANGIOPLASTY WITH STENT PLACEMENT  08/03/2014  . CORONARY ANGIOPLASTY WITH STENT PLACEMENT  2005   [place], [place]   . SHOULDER SURGERY Bilateral    Rotator Cuff Repair    Family History  Problem Relation Age of Onset  . Heart disease Maternal Grandmother   . Heart attack Maternal Grandmother     No  Known Allergies  Current Outpatient Medications on File Prior to Visit  Medication Sig Dispense Refill  . atorvastatin (LIPITOR) 80 MG tablet Take 1 tablet (80 mg total) by mouth daily. For cholesterol. 90 tablet 3  . doxycycline (VIBRA-TABS) 100 MG tablet Take 1 tablet (100 mg total) by mouth 2 (two) times daily. 20 tablet 0  . lisinopril (ZESTRIL) 10 MG tablet TAKE 1 TABLET (10 MG TOTAL) BY MOUTH DAILY. FOR BLOOD PRESSURE 90 tablet 1  . metoprolol tartrate (LOPRESSOR) 50 MG tablet TAKE 1 TABLET BY MOUTH TWICE A DAY 180 tablet 1  . nitroGLYCERIN (NITROSTAT) 0.4 MG SL tablet TAKE 1 TABLET EVERY 5 MINS up to three doses as needed for chest pain. 30 tablet 0  . vitamin E 400 UNIT capsule Take 400 Units by mouth. Taking 3 times a week     No current  facility-administered medications on file prior to visit.    BP 132/82   Pulse (!) 52   Temp 98.4 F (36.9 C) (Temporal)   Ht 5\' 8"  (1.727 m)   Wt 209 lb 6.4 oz (95 kg)   SpO2 97%   BMI 31.84 kg/m    Objective:   Physical Exam Constitutional:      Appearance: He is well-nourished.  Cardiovascular:     Rate and Rhythm: Normal rate and regular rhythm.  Pulmonary:     Effort: Pulmonary effort is normal.     Breath sounds: Normal breath sounds.  Musculoskeletal:     Cervical back: Neck supple.  Skin:    General: Skin is warm and dry.  Psychiatric:        Mood and Affect: Mood and affect normal.            Assessment & Plan:

## 2020-10-14 NOTE — Patient Instructions (Addendum)
Stop by the lab prior to leaving today. I will notify you of your results once received.   You will be contacted regarding your referral for lung cancer screening and also for the colonoscopy.  Please let us know if you have not been contacted within two weeks.   It was a pleasure to see you today!

## 2020-10-14 NOTE — Assessment & Plan Note (Signed)
Referral placed again to lung cancer screening program.  He will notify if he does not hear back within 2 weeks.

## 2020-10-14 NOTE — Assessment & Plan Note (Signed)
LDL goal of less than 70, repeat lipid panel pending today since atorvastatin was increased at 80 mg.  Continue atorvastatin 80 mg.

## 2020-10-14 NOTE — Assessment & Plan Note (Signed)
LDL too high at 115 in October 2021 despite compliance to atorvastatin 40 mg.  Repeat lipid panel pending today since he has been on atorvastatin 80 mg daily since.  Continue atorvastatin 80 mg.

## 2020-10-28 ENCOUNTER — Telehealth: Payer: Self-pay | Admitting: *Deleted

## 2020-10-28 ENCOUNTER — Other Ambulatory Visit: Payer: Self-pay

## 2020-10-28 ENCOUNTER — Telehealth (INDEPENDENT_AMBULATORY_CARE_PROVIDER_SITE_OTHER): Payer: Self-pay | Admitting: Gastroenterology

## 2020-10-28 DIAGNOSIS — Z1211 Encounter for screening for malignant neoplasm of colon: Secondary | ICD-10-CM

## 2020-10-28 MED ORDER — GOLYTELY 236 G PO SOLR: 4000.0000 mL | Freq: Once | ORAL | 0 refills | Status: AC

## 2020-10-28 NOTE — Telephone Encounter (Signed)
Attempted to contact and schedule lung screening scan. Message left for patient to call back to schedule. 

## 2020-10-28 NOTE — Progress Notes (Signed)
Gastroenterology Pre-Procedure Review  Request Date: Tuesday 11/16/20 Requesting Physician: Dr. Vicente Males  PATIENT REVIEW QUESTIONS: The patient responded to the following health history questions as indicated:    1. Are you having any GI issues? no 2. Do you have a personal history of Polyps? no 3. Do you have a family history of Colon Cancer or Polyps? no 4. Diabetes Mellitus? no 5. Joint replacements in the past 12 months?no 6. Major health problems in the past 3 months?no 7. Any artificial heart valves, MVP, or defibrillator?no    MEDICATIONS & ALLERGIES:    Patient reports the following regarding taking any anticoagulation/antiplatelet therapy:   Plavix, Coumadin, Eliquis, Xarelto, Lovenox, Pradaxa, Brilinta, or Effient? no Aspirin? no  Patient confirms/reports the following medications:  Current Outpatient Medications  Medication Sig Dispense Refill  . atorvastatin (LIPITOR) 80 MG tablet Take 1 tablet (80 mg total) by mouth daily. For cholesterol. 90 tablet 3  . doxycycline (VIBRA-TABS) 100 MG tablet Take 1 tablet (100 mg total) by mouth 2 (two) times daily. 20 tablet 0  . hydrochlorothiazide (HYDRODIURIL) 25 MG tablet TAKE 1 TABLET BY MOUTH DAILY FOR SWELLING AND BLOOD PRESSURE 90 tablet 1  . lisinopril (ZESTRIL) 10 MG tablet TAKE 1 TABLET (10 MG TOTAL) BY MOUTH DAILY. FOR BLOOD PRESSURE 90 tablet 1  . metoprolol tartrate (LOPRESSOR) 50 MG tablet TAKE 1 TABLET BY MOUTH TWICE A DAY 180 tablet 1  . nitroGLYCERIN (NITROSTAT) 0.4 MG SL tablet TAKE 1 TABLET EVERY 5 MINS up to three doses as needed for chest pain. 30 tablet 0  . vitamin E 400 UNIT capsule Take 400 Units by mouth. Taking 3 times a week     No current facility-administered medications for this visit.    Patient confirms/reports the following allergies:  No Known Allergies  No orders of the defined types were placed in this encounter.   AUTHORIZATION INFORMATION Primary Insurance: 1D#: Group #:  Secondary  Insurance: 1D#: Group #:  SCHEDULE INFORMATION: Date: 11/16/20 Time: Location:[facility]

## 2020-11-01 ENCOUNTER — Telehealth: Payer: Self-pay | Admitting: *Deleted

## 2020-11-01 NOTE — Telephone Encounter (Signed)
Attempted to contact and schedule lung screening scan. Message left for patient to call back to schedule. 

## 2020-11-12 ENCOUNTER — Other Ambulatory Visit: Payer: Federal, State, Local not specified - PPO | Attending: Gastroenterology

## 2020-11-15 ENCOUNTER — Other Ambulatory Visit: Payer: Self-pay

## 2020-11-15 NOTE — Progress Notes (Signed)
Procedure has been rescheduled and updated instructions will be mailed to home address. Endo unit has been notified about change.

## 2020-11-24 ENCOUNTER — Telehealth: Payer: Self-pay | Admitting: *Deleted

## 2020-11-24 NOTE — Telephone Encounter (Signed)
Attempted to contact patient for lung screening scan. Left message for patient to call [phone] to schedule CT scan.

## 2020-11-26 ENCOUNTER — Other Ambulatory Visit
Admission: RE | Admit: 2020-11-26 | Discharge: 2020-11-26 | Disposition: A | Discharge status: Home / Self Care | Payer: Federal, State, Local not specified - PPO | Source: Ambulatory Visit | Attending: Gastroenterology | Admitting: Gastroenterology

## 2020-11-26 ENCOUNTER — Other Ambulatory Visit: Payer: Self-pay

## 2020-11-26 DIAGNOSIS — Z01812 Encounter for preprocedural laboratory examination: Secondary | ICD-10-CM | POA: Insufficient documentation

## 2020-11-26 DIAGNOSIS — Z20822 Contact with and (suspected) exposure to covid-19: Secondary | ICD-10-CM | POA: Insufficient documentation

## 2020-11-27 LAB — SARS CORONAVIRUS 2 (TAT 6-24 HRS): SARS Coronavirus 2: NEGATIVE

## 2020-11-29 ENCOUNTER — Encounter: Payer: Self-pay | Admitting: Gastroenterology

## 2020-11-30 ENCOUNTER — Ambulatory Visit
Admission: RE | Admit: 2020-11-30 | Discharge: 2020-11-30 | Disposition: A | Discharge status: Home / Self Care | Payer: Federal, State, Local not specified - PPO | Attending: Gastroenterology | Admitting: Gastroenterology

## 2020-11-30 ENCOUNTER — Encounter: Payer: Self-pay | Admitting: Gastroenterology

## 2020-11-30 ENCOUNTER — Other Ambulatory Visit: Payer: Self-pay

## 2020-11-30 ENCOUNTER — Encounter
Admission: RE | Disposition: A | Discharge status: Home / Self Care | Payer: Self-pay | Source: Home / Self Care | Attending: Gastroenterology

## 2020-11-30 ENCOUNTER — Ambulatory Visit: Payer: Federal, State, Local not specified - PPO | Admitting: Certified Registered Nurse Anesthetist

## 2020-11-30 DIAGNOSIS — K635 Polyp of colon: Secondary | ICD-10-CM

## 2020-11-30 DIAGNOSIS — D120 Benign neoplasm of cecum: Secondary | ICD-10-CM | POA: Insufficient documentation

## 2020-11-30 DIAGNOSIS — K5730 Diverticulosis of large intestine without perforation or abscess without bleeding: Secondary | ICD-10-CM | POA: Insufficient documentation

## 2020-11-30 DIAGNOSIS — F17210 Nicotine dependence, cigarettes, uncomplicated: Secondary | ICD-10-CM | POA: Diagnosis not present

## 2020-11-30 DIAGNOSIS — D122 Benign neoplasm of ascending colon: Secondary | ICD-10-CM | POA: Insufficient documentation

## 2020-11-30 DIAGNOSIS — Z79899 Other long term (current) drug therapy: Secondary | ICD-10-CM | POA: Insufficient documentation

## 2020-11-30 DIAGNOSIS — Z1211 Encounter for screening for malignant neoplasm of colon: Secondary | ICD-10-CM | POA: Diagnosis present

## 2020-11-30 HISTORY — PX: COLONOSCOPY WITH PROPOFOL: SHX5780

## 2020-11-30 SURGERY — COLONOSCOPY WITH PROPOFOL
Anesthesia: General

## 2020-11-30 MED ORDER — LIDOCAINE HCL (CARDIAC) PF 100 MG/5ML IV SOSY
PREFILLED_SYRINGE | INTRAVENOUS | Status: DC | PRN
  Administered 2020-11-30: 50 mg via INTRAVENOUS

## 2020-11-30 MED ORDER — PROPOFOL 500 MG/50ML IV EMUL
INTRAVENOUS | Status: DC | PRN
  Administered 2020-11-30: 150 ug/kg/min via INTRAVENOUS

## 2020-11-30 MED ORDER — LIDOCAINE HCL (PF) 2 % IJ SOLN
INTRAMUSCULAR | Status: AC
  Filled 2020-11-30: qty 5

## 2020-11-30 MED ORDER — PROPOFOL 10 MG/ML IV BOLUS
INTRAVENOUS | Status: DC | PRN
  Administered 2020-11-30: 70 mg via INTRAVENOUS

## 2020-11-30 MED ORDER — SODIUM CHLORIDE 0.9 % IV SOLN: INTRAVENOUS | Status: DC

## 2020-11-30 MED ORDER — PROPOFOL 500 MG/50ML IV EMUL
INTRAVENOUS | Status: AC
  Filled 2020-11-30: qty 50

## 2020-11-30 NOTE — H&P (Signed)
Bryan Bellows, MD [address] [place], [place], [place], [address] [address], [place], [place], [place], [address] Phone: [phone]  Fax: [phone]  Primary Care Physician:  Pleas Koch, NP   Pre-Procedure History & Physical: HPI:  Bryan Calderon is a 75 y.o. adult is here for an colonoscopy.   Past Medical History:  Diagnosis Date  . Alcohol abuse   . Coronary artery disease 2015  . GERD (gastroesophageal reflux disease)   . Heart disease   . Hyperlipidemia   . Hypertension     Past Surgical History:  Procedure Laterality Date  . APPENDECTOMY    . CARDIAC CATHETERIZATION  08/03/2014  . CARDIAC CATHETERIZATION  [place], [place]   . CAROTID STENT  2015 and 2004  . CORONARY ANGIOPLASTY WITH STENT PLACEMENT  08/03/2014  . CORONARY ANGIOPLASTY WITH STENT PLACEMENT  2005   [place], [place]   . SHOULDER SURGERY Bilateral    Rotator Cuff Repair    Prior to Admission medications   Medication Sig Start Date End Date Taking? Authorizing Provider  metoprolol tartrate (LOPRESSOR) 50 MG tablet TAKE 1 TABLET BY MOUTH TWICE A DAY 10/06/20  Yes Pleas Koch, NP  atorvastatin (LIPITOR) 80 MG tablet Take 1 tablet (80 mg total) by mouth daily. For cholesterol. 08/03/20   Pleas Koch, NP  doxycycline (VIBRA-TABS) 100 MG tablet Take 1 tablet (100 mg total) by mouth 2 (two) times daily. 07/30/20   Bedsole, Amy E, MD  hydrochlorothiazide (HYDRODIURIL) 25 MG tablet TAKE 1 TABLET BY MOUTH DAILY FOR SWELLING AND BLOOD PRESSURE 10/14/20   Pleas Koch, NP  lisinopril (ZESTRIL) 10 MG tablet TAKE 1 TABLET (10 MG TOTAL) BY MOUTH DAILY. FOR BLOOD PRESSURE 10/06/20   Pleas Koch, NP  nitroGLYCERIN (NITROSTAT) 0.4 MG SL tablet TAKE 1 TABLET EVERY 5 MINS up to three doses as needed for chest pain. 02/13/18   Pleas Koch, NP  vitamin E 400 UNIT capsule Take 400 Units by mouth. Taking 3 times a week    [provider]    Allergies as of  10/28/2020  . (No Known Allergies)    Family History  Problem Relation Age of Onset  . Heart disease Maternal Grandmother   . Heart attack Maternal Grandmother     Social History   Socioeconomic History  . Marital status: Married    Spouse name: Not on file  . Number of children: Not on file  . Years of education: Not on file  . Highest education level: Not on file  Occupational History  . Not on file  Tobacco Use  . Smoking status: Current Some Day Smoker    Packs/day: 1.00    Years: 35.00    Pack years: 35.00    Types: Cigarettes  . Smokeless tobacco: Never Used  . Tobacco comment: about 3 cigarettes a day  Substance and Sexual Activity  . Alcohol use: Yes    Alcohol/week: 0.0 standard drinks    Comment: weekend socially  . Drug use: No  . Sexual activity: Not on file  Other Topics Concern  . Not on file  Social History Narrative   Married.   8 children   Retired. Looking for part time work.   Enjoys fishing, sports, spending time with family.   Social Determinants of Health   Financial Resource Strain: Not on file  Food Insecurity: Not on file  Transportation Needs: Not on file  Physical Activity:  Not on file  Stress: Not on file  Social Connections: Not on file  Intimate Partner Violence: Not on file    Review of Systems: See HPI, otherwise negative ROS  Physical Exam: BP (!) 154/84   Pulse 68   Temp 97.9 F (36.6 C) (Temporal)   Resp 18   Ht 5\' 8"  (1.727 m)   Wt 92.1 kg   SpO2 99%   BMI 30.87 kg/m  General:   Alert,  pleasant and cooperative in NAD Head:  Normocephalic and atraumatic. Neck:  Supple; no masses or thyromegaly. Lungs:  Clear throughout to auscultation, normal respiratory effort.    Heart:  +S1, +S2, Regular rate and rhythm, No edema. Abdomen:  Soft, nontender and nondistended. Normal bowel sounds, without guarding, and without rebound.   Neurologic:  Alert and  oriented x4;  grossly normal  neurologically.  Impression/Plan: Emrick Eric Form is here for an colonoscopy to be performed for Screening colonoscopy average risk   Risks, benefits, limitations, and alternatives regarding  colonoscopy have been reviewed with the patient.  Questions have been answered.  All parties agreeable.   Bryan Bellows, MD  11/30/2020, 8:55 AM

## 2020-11-30 NOTE — Anesthesia Preprocedure Evaluation (Signed)
Anesthesia Evaluation  Patient identified by MRN, date of birth, ID band Patient awake    Reviewed: Allergy & Precautions, H&P , NPO status , Patient's Chart, lab work & pertinent test results  Airway Mallampati: II  TM Distance: >3 FB Neck ROM: Full    Dental no notable dental hx.    Pulmonary COPD, Current Smoker and Patient abstained from smoking.,    Pulmonary exam normal        Cardiovascular hypertension, + CAD and + Cardiac Stents  Normal cardiovascular exam     Neuro/Psych PSYCHIATRIC DISORDERS Depression negative neurological ROS     GI/Hepatic Neg liver ROS, Bowel prep,GERD  Controlled,  Endo/Other  negative endocrine ROS  Renal/GU negative Renal ROS  negative genitourinary   Musculoskeletal negative musculoskeletal ROS (+)   Abdominal   Peds negative pediatric ROS (+)  Hematology negative hematology ROS (+)   Anesthesia Other Findings   Reproductive/Obstetrics negative OB ROS                             Anesthesia Physical Anesthesia Plan  ASA: III  Anesthesia Plan: General   Post-op Pain Management:    Induction: Intravenous  PONV Risk Score and Plan: 2 and Propofol infusion and TIVA  Airway Management Planned: Natural Airway and Nasal Cannula  Additional Equipment:   Intra-op Plan:   Post-operative Plan:   Informed Consent: I have reviewed the patients History and Physical, chart, labs and discussed the procedure including the risks, benefits and alternatives for the proposed anesthesia with the patient or authorized representative who has indicated his/her understanding and acceptance.       Plan Discussed with: CRNA and Anesthesiologist  Anesthesia Plan Comments:         Anesthesia Quick Evaluation

## 2020-11-30 NOTE — Transfer of Care (Signed)
Immediate Anesthesia Transfer of Care Note  Patient: Bryan Calderon  Procedure(s) Performed: COLONOSCOPY WITH PROPOFOL (N/A )  Patient Location: PACU  Anesthesia Type:General  Level of Consciousness: awake and alert   Airway & Oxygen Therapy: Patient Spontanous Breathing  Post-op Assessment: Report given to RN and Post -op Vital signs reviewed and stable  Post vital signs: Reviewed and stable  Last Vitals:  Vitals Value Taken Time  BP 131/79 11/30/20 0935  Temp 37 C 11/30/20 0934  Pulse 55 11/30/20 0935  Resp 19 11/30/20 0935  SpO2 99 % 11/30/20 0935    Last Pain:  Vitals:   11/30/20 0934  TempSrc: Temporal  PainSc: 0-No pain         Complications: No complications documented.

## 2020-11-30 NOTE — Anesthesia Procedure Notes (Signed)
Date/Time: 11/30/2020 9:09 AM Performed by: Johnna Acosta, CRNA Pre-anesthesia Checklist: Patient identified, Emergency Drugs available, Suction available, Patient being monitored and Timeout performed Patient Re-evaluated:Patient Re-evaluated prior to induction Oxygen Delivery Method: Nasal cannula Preoxygenation: Pre-oxygenation with 100% oxygen Induction Type: IV induction

## 2020-11-30 NOTE — Anesthesia Postprocedure Evaluation (Signed)
Anesthesia Post Note  Patient: Bryan Calderon  Procedure(s) Performed: COLONOSCOPY WITH PROPOFOL (N/A )  Patient location during evaluation: Phase II Anesthesia Type: General Level of consciousness: awake and alert, awake and oriented Pain management: pain level controlled Vital Signs Assessment: post-procedure vital signs reviewed and stable Respiratory status: spontaneous breathing, nonlabored ventilation and respiratory function stable Cardiovascular status: blood pressure returned to baseline and stable Postop Assessment: no apparent nausea or vomiting Anesthetic complications: no   No complications documented.   Last Vitals:  Vitals:   11/30/20 0944 11/30/20 0954  BP: (!) 173/72 (!) 159/70  Pulse: (!) 54 (!) 54  Resp: 15 15  Temp:    SpO2: 100% 98%    Last Pain:  Vitals:   11/30/20 0954  TempSrc:   PainSc: 0-No pain                 Phill Mutter

## 2020-11-30 NOTE — Op Note (Signed)
[facility] Gastroenterology Patient Name: Bryan Calderon Procedure Date: 11/30/2020 8:54 AM MRN: [id number] Account #: [id number] Date of Birth: 1946/07/30 Admit Type: Outpatient Age: 75 Room: [facility] ENDO ROOM 2 Gender: Male Note Status: Finalized Procedure:             Colonoscopy Indications:           Screening for colorectal malignant neoplasm Providers:             Jonathon Bellows MD, MD Referring MD:          Pleas Koch (Referring MD) Medicines:             Monitored Anesthesia Care Complications:         No immediate complications. Procedure:             Pre-Anesthesia Assessment:                        - Prior to the procedure, a History and Physical was                         performed, and patient medications, allergies and                         sensitivities were reviewed. The patient's tolerance                         of previous anesthesia was reviewed.                        - The risks and benefits of the procedure and the                         sedation options and risks were discussed with the                         patient. All questions were answered and informed                         consent was obtained.                        - ASA Grade Assessment: II - A patient with mild                         systemic disease.                        After obtaining informed consent, the colonoscope was                         passed under direct vision. Throughout the procedure,                         the patient's blood pressure, pulse, and oxygen                         saturations were monitored continuously. The                         Colonoscope was introduced through the anus and  advanced to the the cecum, identified by the                         appendiceal orifice. The colonoscopy was performed                         with ease. The patient tolerated the procedure well.                         The quality of  the bowel preparation was excellent. Findings:      The perianal and digital rectal examinations were normal.      Multiple small-mouthed diverticula were found in the entire colon.      Two sessile polyps were found in the ascending colon. The polyps were 4       to 5 mm in size. These polyps were removed with a cold snare. Resection       and retrieval were complete. To prevent bleeding after the polypectomy,       one hemostatic clip was successfully placed. There was no bleeding at       the end of the procedure.      A 5 mm polyp was found in the cecum. The polyp was sessile. The polyp       was removed with a cold snare. Resection and retrieval were complete.      The exam was otherwise without abnormality on direct and retroflexion       views. Impression:            - Diverticulosis in the entire examined colon.                        - Two 4 to 5 mm polyps in the ascending colon, removed                         with a cold snare. Resected and retrieved. Clip was                         placed.                        - One 5 mm polyp in the cecum, removed with a cold                         snare. Resected and retrieved.                        - The examination was otherwise normal on direct and                         retroflexion views. Recommendation:        - Discharge patient to home (with escort).                        - Resume previous diet.                        - Continue present medications.                        - Await pathology  results.                        - Repeat colonoscopy for surveillance based on                         pathology results. Procedure Code(s):     --- Professional ---                        [id number], Colonoscopy, flexible; with removal of                         tumor(s), polyp(s), or other lesion(s) by snare                         technique Diagnosis Code(s):     --- Professional ---                        K63.5, Polyp of colon                         Z12.11, Encounter for screening for malignant neoplasm                         of colon                        K57.30, Diverticulosis of large intestine without                         perforation or abscess without bleeding CPT copyright 2019 American Medical Association. All rights reserved. The codes documented in this report are preliminary and upon coder review may  be revised to meet current compliance requirements. Jonathon Bellows, MD Jonathon Bellows MD, MD 11/30/2020 9:31:47 AM This report has been signed electronically. Number of Addenda: 0 Note Initiated On: 11/30/2020 8:54 AM Scope Withdrawal Time: 0 hours 12 minutes 36 seconds  Total Procedure Duration: 0 hours 13 minutes 34 seconds  Estimated Blood Loss:  Estimated blood loss: none.      [facility]

## 2020-12-01 ENCOUNTER — Encounter: Payer: Self-pay | Admitting: Gastroenterology

## 2020-12-01 LAB — SURGICAL PATHOLOGY

## 2020-12-07 ENCOUNTER — Encounter: Payer: Self-pay | Admitting: Gastroenterology

## 2021-04-01 ENCOUNTER — Other Ambulatory Visit: Payer: Self-pay

## 2021-04-01 ENCOUNTER — Encounter: Payer: Self-pay | Admitting: Primary Care

## 2021-04-01 ENCOUNTER — Ambulatory Visit: Payer: Federal, State, Local not specified - PPO | Admitting: Primary Care

## 2021-04-01 VITALS — BP 118/74 | HR 82 | Temp 97.2°F | Ht 68.0 in | Wt 192.0 lb

## 2021-04-01 DIAGNOSIS — G8929 Other chronic pain: Secondary | ICD-10-CM

## 2021-04-01 DIAGNOSIS — M2540 Effusion, unspecified joint: Secondary | ICD-10-CM

## 2021-04-01 DIAGNOSIS — M25561 Pain in right knee: Secondary | ICD-10-CM

## 2021-04-01 LAB — CBC WITH DIFFERENTIAL/PLATELET
Basophils Absolute: 0.1 10*3/uL (ref 0.0–0.1)
Basophils Relative: 0.7 % (ref 0.0–3.0)
Eosinophils Absolute: 0.1 10*3/uL (ref 0.0–0.7)
Eosinophils Relative: 1.5 % (ref 0.0–5.0)
HCT: 37.3 % — ABNORMAL LOW (ref 39.0–52.0)
Hemoglobin: 12.6 g/dL — ABNORMAL LOW (ref 13.0–17.0)
Lymphocytes Relative: 21.5 % (ref 12.0–46.0)
Lymphs Abs: 1.6 10*3/uL (ref 0.7–4.0)
MCHC: 33.7 g/dL (ref 30.0–36.0)
MCV: 86.4 fl (ref 78.0–100.0)
Monocytes Absolute: 0.6 10*3/uL (ref 0.1–1.0)
Monocytes Relative: 7.7 % (ref 3.0–12.0)
Neutro Abs: 5.1 10*3/uL (ref 1.4–7.7)
Neutrophils Relative %: 68.6 % (ref 43.0–77.0)
Platelets: 218 10*3/uL (ref 150.0–400.0)
RBC: 4.31 Mil/uL (ref 4.22–5.81)
RDW: 16 % — ABNORMAL HIGH (ref 11.5–15.5)
WBC: 7.4 10*3/uL (ref 4.0–10.5)

## 2021-04-01 LAB — C-REACTIVE PROTEIN: CRP: 1 mg/dL (ref 0.5–20.0)

## 2021-04-01 LAB — URIC ACID: Uric Acid, Serum: 6.6 mg/dL (ref 4.0–7.8)

## 2021-04-01 MED ORDER — METHYLPREDNISOLONE ACETATE 80 MG/ML IJ SUSP
80.0000 mg | Freq: Once | INTRAMUSCULAR | Status: AC
  Administered 2021-04-01: 80 mg via INTRAMUSCULAR

## 2021-04-01 NOTE — Addendum Note (Signed)
Addended by: Francella Solian on: 04/01/2021 12:24 PM   Modules accepted: Orders

## 2021-04-01 NOTE — Assessment & Plan Note (Signed)
With obvious soft tissue swelling and tenderness. Will obtain xray records from [facility].  Checking labs today including CBC, uric acid, RF labs.  He declines PT. Consider MRI vs orthopedic evaluation if needed.  IM Depo Medrol 80 mg provided today.  Await results.

## 2021-04-01 NOTE — Progress Notes (Signed)
Subjective:    [name], male    DOB: 09-11-1946, 75 y.o.   MRN: [id number]  HPI  Bryan Calderon is a very pleasant 75 y.o. male with a history of CAD, hypertension, prediabetes, COPD who presents today to discuss knee pain.  His pain is located to the entire anterior right knee which began several months ago. He was evaluated by Urgent Care, was told that he has inflammation, was provided with Meloxicam 15 mg. He underwent xray which was negative.   Since then he's noticed no improvement. Pain is worse when first getting up and taking that first step. He found no improvement with meloxicam. His knee will sometimes "gives out".   He did have a fall a few months ago as the knee gave out, no injury from that. He's been wearing a brace for support.   He denies erythema, warmth, history of gout.     Review of Systems  Constitutional:  Negative for fever.  Musculoskeletal:  Positive for arthralgias and joint swelling.  Neurological:  Positive for weakness.        Past Medical History:  Diagnosis Date   Alcohol abuse    Coronary artery disease 2015   GERD (gastroesophageal reflux disease)    Heart disease    Hyperlipidemia    Hypertension     Social History   Socioeconomic History   Marital status: Married    Spouse name: Not on file   Number of children: Not on file   Years of education: Not on file   Highest education level: Not on file  Occupational History   Not on file  Tobacco Use   Smoking status: Some Days    Packs/day: 1.00    Years: 35.00    Pack years: 35.00    Types: Cigarettes   Smokeless tobacco: Never   Tobacco comments:    about 3 cigarettes a day  Substance and Sexual Activity   Alcohol use: Yes    Alcohol/week: 0.0 standard drinks    Comment: weekend socially   Drug use: No   Sexual activity: Not on file  Other Topics Concern   Not on file  Social History Narrative   Married.   8 children   Retired. Looking for  part time work.   Enjoys fishing, sports, spending time with family.   Social Determinants of Health   Financial Resource Strain: Not on file  Food Insecurity: Not on file  Transportation Needs: Not on file  Physical Activity: Not on file  Stress: Not on file  Social Connections: Not on file  Intimate Partner Violence: Not on file    Past Surgical History:  Procedure Laterality Date   APPENDECTOMY     CARDIAC CATHETERIZATION  08/03/2014   CARDIAC CATHETERIZATION  2005   [place], [place]    CAROTID STENT  2015 and 2004   COLONOSCOPY WITH PROPOFOL N/A 11/30/2020   Procedure: COLONOSCOPY WITH PROPOFOL;  Surgeon: Jonathon Bellows, MD;  Location: [facility] ENDOSCOPY;  Service: Gastroenterology;  Laterality: N/A;   CORONARY ANGIOPLASTY WITH STENT PLACEMENT  08/03/2014   CORONARY ANGIOPLASTY WITH STENT PLACEMENT  2005   [place], [place]    SHOULDER SURGERY Bilateral    Rotator Cuff Repair    Family History  Problem Relation Age of Onset   Heart disease Maternal Grandmother    Heart attack Maternal Grandmother     No Known Allergies  Current Outpatient Medications on File Prior to Visit  Medication Sig Dispense  Refill   atorvastatin (LIPITOR) 80 MG tablet Take 1 tablet (80 mg total) by mouth daily. For cholesterol. 90 tablet 3   hydrochlorothiazide (HYDRODIURIL) 25 MG tablet TAKE 1 TABLET BY MOUTH DAILY FOR SWELLING AND BLOOD PRESSURE 90 tablet 1   lisinopril (ZESTRIL) 10 MG tablet TAKE 1 TABLET (10 MG TOTAL) BY MOUTH DAILY. FOR BLOOD PRESSURE 90 tablet 1   meloxicam (MOBIC) 15 MG tablet Mobic 15 mg tablet  Take 1 tablet every day by oral route.     metoprolol tartrate (LOPRESSOR) 50 MG tablet TAKE 1 TABLET BY MOUTH TWICE A DAY 180 tablet 1   nitroGLYCERIN (NITROSTAT) 0.4 MG SL tablet TAKE 1 TABLET EVERY 5 MINS up to three doses as needed for chest pain. 30 tablet 0   vitamin E 400 UNIT capsule Take 400 Units by mouth. Taking 3 times a week     No current facility-administered medications on  file prior to visit.    BP 118/74   Pulse 82   Temp (!) 97.2 F (36.2 C) (Temporal)   Ht 5\' 8"  (1.727 m)   Wt 192 lb (87.1 kg)   SpO2 98%   BMI 29.19 kg/m  Objective:   Physical Exam Pulmonary:     Effort: Pulmonary effort is normal.  Musculoskeletal:     Right knee: Swelling present. No erythema, bony tenderness or crepitus. Decreased range of motion. Tenderness present over the medial joint line.     Comments: Mild swelling to right anterior knee, tenderness with light palpation to medial side. No warmth or erythema.   Neurological:     Mental Status: He is alert.          Assessment & Plan:      This visit occurred during the SARS-CoV-2 public health emergency.  Safety protocols were in place, including screening questions prior to the visit, additional usage of staff PPE, and extensive cleaning of exam room while observing appropriate contact time as indicated for disinfecting solutions.

## 2021-04-01 NOTE — Patient Instructions (Signed)
Stop by the lab prior to leaving today. I will notify you of your results once received.   It was a pleasure to see you today!  

## 2021-04-04 LAB — RHEUMATOID FACTOR: Rheumatoid fact SerPl-aCnc: <14 IU/mL (ref <14)

## 2021-04-04 LAB — CYCLIC CITRUL PEPTIDE ANTIBODY, IGG: Cyclic Citrullin Peptide Ab: <16 UNITS

## 2021-04-06 ENCOUNTER — Other Ambulatory Visit: Payer: Self-pay | Admitting: Primary Care

## 2021-04-06 ENCOUNTER — Telehealth: Payer: Self-pay | Admitting: Primary Care

## 2021-04-06 DIAGNOSIS — G8929 Other chronic pain: Secondary | ICD-10-CM

## 2021-04-06 MED ORDER — DICLOFENAC SODIUM 75 MG PO TBEC: 75.0000 mg | DELAYED_RELEASE_TABLET | Freq: Two times a day (BID) | ORAL | 0 refills | Status: DC | PRN

## 2021-04-06 NOTE — Telephone Encounter (Signed)
Called in about Labs results , informed him that he  spoke to [place] but doesn't remember talking to her.

## 2021-04-06 NOTE — Telephone Encounter (Signed)
See lab note for further documentation.

## 2021-04-19 ENCOUNTER — Other Ambulatory Visit: Payer: Self-pay | Admitting: Primary Care

## 2021-04-19 DIAGNOSIS — I10 Essential (primary) hypertension: Secondary | ICD-10-CM

## 2021-04-19 NOTE — Progress Notes (Signed)
Bryan Haffey T. Lorren Splawn, MD, [place] at [facility] [place], [address]  Phone: [phone]  FAX: [phone]  Bryan Calderon - 75 y.o. male  MRN [id number]  Date of Birth: January 22, 1946  Date: 04/20/2021  PCP: Pleas Koch, NP  Referral: Pleas Koch, NP  Chief Complaint  Patient presents with   Knee Pain    Right    This visit occurred during the SARS-CoV-2 public health emergency.  Safety protocols were in place, including screening questions prior to the visit, additional usage of staff PPE, and extensive cleaning of exam room while observing appropriate contact time as indicated for disinfecting solutions.   Subjective:   Bryan Calderon is a 75 y.o. very pleasant male patient with Body mass index is 29.76 kg/m. who presents with the following:  He is here for evaluation of knee pain for 2 months.  Several months ago, he was seen in urgent care and he had a knee x-ray that was reportedly normal.  I do not have these for my independent review.  He has been trying some meloxicam, and is also tried some Mobic in the past without much significant relief.  Otherwise, his knee has not really gotten much better at all.  He is also used a compression sleeve.  XR did show some fluid on the knee.   Does some yard work.  Walks.  Maybe a mile.   Medial joint line pain.   No prior trauma or surgery.   Review of Systems is noted in the HPI, as appropriate   Objective:   BP 130/76   Pulse 68   Temp 97.7 F (36.5 C) (Temporal)   Ht 5\' 8"  (1.727 m)   Wt 195 lb 12 oz (88.8 kg)   SpO2 95%   BMI 29.76 kg/m   GEN: No acute distress; alert,appropriate. PULM: Breathing comfortably in no respiratory distress PSYCH: Normally interactive.    Right knee: Full extension, flexion to 100.  Mild effusion.  Stable to varus and valgus stress.  ACL and PCL are intact.  Any form of forced flexion causes pain.   Bounce home testing is negative.  Radiology: No results found.  Assessment and Plan:     ICD-10-CM   1. Acute pain of right knee  M25.561     2. Effusion of right knee joint  M25.461      While I do not have any x-rays for my review, my suspicion is that arthritic changes are playing a role here.  He does have an acute effusion.  Continue with ice, oral anti-inflammatories, add topical Voltaren.  I am also going to do a therapeutic knee injection given his failure of other treatment thus far.  Aspiration/Injection Procedure Note Bryan Calderon 26-Oct-1945 Date of procedure: 04/20/2021  Procedure: Large Joint Aspiration / Injection of Knee, R Indications: Pain  Procedure Details Patient verbally consented to procedure. Risks, benefits, and alternatives explained. Sterilely prepped with Chloraprep. Ethyl cholride used for anesthesia. 9 cc Lidocaine 1% mixed with 1 mL of Kenalog 40 mg injected using the anteromedial approach without difficulty. No complications with procedure and tolerated well. Patient had decreased pain post-injection. Medication: 1 mL of Kenalog 40 mg   Patient Instructions  Voltaren 1% gel, over the counter You can apply up to 4 times a day  This can be applied to any joint: knee, wrist, fingers, elbows, shoulders, feet and ankles. Can apply to any tendon:  tennis elbow, achilles, tendon, rotator cuff or any other tendon.  Minimal is absorbed in the bloodstream: ok with oral anti-inflammatory or a blood thinner.  Cost is about 9 dollars    Signed,  Frederico Hamman T. Aneesa Romey, MD   Outpatient Encounter Medications as of 04/20/2021  Medication Sig   atorvastatin (LIPITOR) 80 MG tablet Take 1 tablet (80 mg total) by mouth daily. For cholesterol.   diclofenac (VOLTAREN) 75 MG EC tablet Take 1 tablet (75 mg total) by mouth 2 (two) times daily as needed for moderate pain.   hydrochlorothiazide (HYDRODIURIL) 25 MG tablet TAKE 1 TABLET BY MOUTH DAILY FOR SWELLING  AND BLOOD PRESSURE   lisinopril (ZESTRIL) 10 MG tablet TAKE 1 TABLET (10 MG TOTAL) BY MOUTH DAILY. FOR BLOOD PRESSURE   metoprolol tartrate (LOPRESSOR) 50 MG tablet TAKE 1 TABLET BY MOUTH TWICE A DAY for blood pressure.   nitroGLYCERIN (NITROSTAT) 0.4 MG SL tablet TAKE 1 TABLET EVERY 5 MINS up to three doses as needed for chest pain.   vitamin E 400 UNIT capsule Take 400 Units by mouth. Taking 3 times a week   No facility-administered encounter medications on file as of 04/20/2021.

## 2021-04-20 ENCOUNTER — Other Ambulatory Visit: Payer: Self-pay

## 2021-04-20 ENCOUNTER — Encounter: Payer: Self-pay | Admitting: Family Medicine

## 2021-04-20 ENCOUNTER — Ambulatory Visit: Payer: Federal, State, Local not specified - PPO | Admitting: Family Medicine

## 2021-04-20 VITALS — BP 130/76 | HR 68 | Temp 97.7°F | Ht 68.0 in | Wt 195.8 lb

## 2021-04-20 DIAGNOSIS — M25461 Effusion, right knee: Secondary | ICD-10-CM

## 2021-04-20 DIAGNOSIS — G8929 Other chronic pain: Secondary | ICD-10-CM

## 2021-04-20 DIAGNOSIS — M25561 Pain in right knee: Secondary | ICD-10-CM | POA: Diagnosis not present

## 2021-04-20 MED ORDER — TRIAMCINOLONE ACETONIDE 40 MG/ML IJ SUSP
40.0000 mg | Freq: Once | INTRAMUSCULAR | Status: AC
  Administered 2021-04-20: 40 mg via INTRA_ARTICULAR

## 2021-04-20 NOTE — Addendum Note (Signed)
Addended by: Carter Kitten on: 04/20/2021 09:45 AM   Modules accepted: Orders

## 2021-04-20 NOTE — Patient Instructions (Signed)
Voltaren 1% gel, over the counter ?You can apply up to 4 times a day ? ?This can be applied to any joint: knee, wrist, fingers, elbows, shoulders, feet and ankles. ?Can apply to any tendon: tennis elbow, achilles, tendon, rotator cuff or any other tendon. ? ?Minimal is absorbed in the bloodstream: ok with oral anti-inflammatory or a blood thinner. ? ?Cost is about 9 dollars  ?

## 2021-05-23 ENCOUNTER — Ambulatory Visit: Payer: Federal, State, Local not specified - PPO | Admitting: Family Medicine

## 2021-06-24 ENCOUNTER — Other Ambulatory Visit: Payer: Self-pay | Admitting: Primary Care

## 2021-06-24 DIAGNOSIS — I10 Essential (primary) hypertension: Secondary | ICD-10-CM

## 2021-07-01 ENCOUNTER — Ambulatory Visit: Payer: Federal, State, Local not specified - PPO | Admitting: Nurse Practitioner

## 2021-07-01 ENCOUNTER — Other Ambulatory Visit: Payer: Self-pay

## 2021-07-01 ENCOUNTER — Encounter: Payer: Self-pay | Admitting: Nurse Practitioner

## 2021-07-01 VITALS — BP 134/76 | HR 61 | Ht 68.0 in | Wt 184.8 lb

## 2021-07-01 DIAGNOSIS — I10 Essential (primary) hypertension: Secondary | ICD-10-CM

## 2021-07-01 DIAGNOSIS — Z720 Tobacco use: Secondary | ICD-10-CM | POA: Diagnosis not present

## 2021-07-01 DIAGNOSIS — I25118 Atherosclerotic heart disease of native coronary artery with other forms of angina pectoris: Secondary | ICD-10-CM | POA: Diagnosis not present

## 2021-07-01 DIAGNOSIS — R0789 Other chest pain: Secondary | ICD-10-CM | POA: Diagnosis not present

## 2021-07-01 DIAGNOSIS — E785 Hyperlipidemia, unspecified: Secondary | ICD-10-CM

## 2021-07-01 DIAGNOSIS — Z0000 Encounter for general adult medical examination without abnormal findings: Secondary | ICD-10-CM

## 2021-07-01 MED ORDER — ASPIRIN EC 81 MG PO TBEC: 81.0000 mg | DELAYED_RELEASE_TABLET | Freq: Every day | ORAL | 3 refills | Status: AC

## 2021-07-01 NOTE — Patient Instructions (Signed)
Medication Instructions:  Your physician has recommended you make the following change in your medication:   START Aspirin 81 mg once a day  *If you need a refill on your cardiac medications before your next appointment, please call your pharmacy*   Lab Work: None   If you have labs (blood work) drawn today and your tests are completely normal, you will receive your results only by: [place] (if you have MyChart) OR A paper copy in the mail If you have any lab test that is abnormal or we need to change your treatment, we will call you to review the results.   Testing/Procedures: None   Follow-Up: At [facility], you and your health needs are our priority.  As part of our continuing mission to provide you with exceptional heart care, we have created designated Provider Care Teams.  These Care Teams include your primary Cardiologist (physician) and Advanced Practice Providers (APPs -  Physician Assistants and Nurse Practitioners) who all work together to provide you with the care you need, when you need it.  We recommend signing up for the patient portal called "MyChart".  Sign up information is provided on this After Visit Summary.  MyChart is used to connect with patients for Virtual Visits (Telemedicine).  Patients are able to view lab/test results, encounter notes, upcoming appointments, etc.  Non-urgent messages can be sent to your provider as well.   To learn more about what you can do with MyChart, go to [email or web address].    Your next appointment:   4-6 month(s)  The format for your next appointment:   In Person  Provider:   Ida Rogue, MD or Murray Hodgkins, NP   Other Instructions Referral placed to Pulmonary. They will call you to arrange appointment. Their number is [phone].

## 2021-07-01 NOTE — Progress Notes (Signed)
Cardiology Clinic Note   Patient Name: Bryan Calderon [facility] Date of Encounter: 07/01/2021  Primary Care Provider:  Pleas Koch, NP Primary Cardiologist:  Ida Rogue, MD  Patient Profile    75 year old male with a history of CAD status post prior LAD stenting, hypertension, hyperlipidemia, GERD, tobacco abuse, and alcohol use, who presents for follow-up related to sharp/fleeting chest pain.  Past Medical History    Past Medical History:  Diagnosis Date   Alcohol abuse    Coronary artery disease    a. ~ December 27, 2003 s/p PCI to the LAD in [place], [place]; b. 07/2014 PCI: LM nl, LAD 90 ISR (3.0x23 Xience Alpine DES), LCX 75m, OM1 40, OM3 60, RCA 69m, 40d, RPL1 30. EF 55%.   GERD (gastroesophageal reflux disease)    Heart disease    Hyperlipidemia    Hypertension    Past Surgical History:  Procedure Laterality Date   APPENDECTOMY     CARDIAC CATHETERIZATION  08/03/2014   CARDIAC CATHETERIZATION  10/10/2003   [place], [place]    COLONOSCOPY WITH PROPOFOL N/A 11/30/2020   Procedure: COLONOSCOPY WITH PROPOFOL;  Surgeon: Jonathon Bellows, MD;  Location: [facility] ENDOSCOPY;  Service: Gastroenterology;  Laterality: N/A;   CORONARY ANGIOPLASTY WITH STENT PLACEMENT  08/03/2014   CORONARY ANGIOPLASTY WITH STENT PLACEMENT  10/10/2003   [place], [place]    SHOULDER SURGERY Bilateral    Rotator Cuff Repair    Allergies  No Known Allergies  History of Present Illness    75 year old male with the above past medical history including CAD, hypertension, hyperlipidemia, GERD, tobacco abuse, and alcohol use.  Cardiac history dates back to approximately 12-27-2003, when he underwent PCI of the LAD in [place], [place].  He subsequently moved to the [place] area and in 12/26/2013 was admitted with unstable angina.  Diagnostic catheterization revealed 90% in-stent restenosis within the LAD and otherwise moderate, nonobstructive CAD, with normal LV function.  The LAD was successfully treated with a drug-eluting  stent.  He establish care with Dr. Rockey Situ in December 2016.  He had stopped taking prasugrel at that point and was advised to continue aspirin therapy indefinitely.  He has not been seen since December 2016 but has been closely followed by primary care.  He lives locally with his wife and is reasonably active.  He says he walks around his yard about 3 times per week and also uses some light weights that he has in his house.  He does not typically experience chest pain or dyspnea on exertion, though steep inclines or stairs will result in dyspnea.  He still smokes about a pack a day.  In 12-27-18, he underwent chest CT for cancer screening, which was negative for pulmonary nodules.  Recommendation was made for follow-up low-dose chest CT in a year.  He has not had a CT since 12-27-18.  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  In that setting, he has lost about 20 pounds over the past few years.  [name] was in his usual state of health until last night, when while sleeping, he awoke suddenly with sharp and fleeting left lower chest pain, underneath the nipple line, lasting about 3 or 4 seconds, and resolving spontaneously.  It was painful enough to wake him up.  There were no associated symptoms.  He got up and walked around his home for a few minutes and when symptoms did not return,  he returned to bed.  His wife told him he needed to call the cardiologist this morning, which he did and an appointment was made for this afternoon.  He is currently asymptomatic.  He notes that prior anginal equivalent in both 2005 and 2015 consisted of severe chest pressure associated with dyspnea.  As noted, he has not had any of those symptoms since 2015.  Home Medications    Current Outpatient Medications  Medication Sig Dispense Refill   aspirin EC 81 MG tablet Take 1 tablet (81 mg total) by mouth daily. Swallow whole. 90  tablet 3   atorvastatin (LIPITOR) 80 MG tablet Take 1 tablet (80 mg total) by mouth daily. For cholesterol. 90 tablet 3   diclofenac (VOLTAREN) 75 MG EC tablet Take 1 tablet (75 mg total) by mouth 2 (two) times daily as needed for moderate pain. 30 tablet 0   hydrochlorothiazide (HYDRODIURIL) 25 MG tablet TAKE 1 TABLET BY MOUTH DAILY FOR SWELLING AND BLOOD PRESSURE 90 tablet 0   lisinopril (ZESTRIL) 10 MG tablet Take 1 tablet (10 mg total) by mouth daily. For blood pressure. Due for office visit in November for additional refills. 90 tablet 0   metoprolol tartrate (LOPRESSOR) 50 MG tablet TAKE 1 TABLET BY MOUTH TWICE A DAY for blood pressure. 180 tablet 0   nitroGLYCERIN (NITROSTAT) 0.4 MG SL tablet TAKE 1 TABLET EVERY 5 MINS up to three doses as needed for chest pain. 30 tablet 0   vitamin E 400 UNIT capsule Take 400 Units by mouth. Taking 3 times a week     No current facility-administered medications for this visit.     Family History    Family History  Problem Relation Age of Onset   Heart disease Maternal Grandmother    Heart attack Maternal Grandmother    He indicated that his mother is deceased. He indicated that his father is deceased. He indicated that his maternal grandmother is deceased.  Social History    Social History   Socioeconomic History   Marital status: Married    Spouse name: Not on file   Number of children: Not on file   Years of education: Not on file   Highest education level: Not on file  Occupational History   Not on file  Tobacco Use   Smoking status: Some Days    Packs/day: 1.00    Years: 35.00    Pack years: 35.00    Types: Cigarettes   Smokeless tobacco: Never   Tobacco comments:    about 3 cigarettes a day  Substance and Sexual Activity   Alcohol use: Yes    Comment: weekend socially - 8 oz of liquor w/ mixed drinks over an occasional weekend   Drug use: Never   Sexual activity: Not on file  Other Topics Concern   Not on file  Social  History Narrative   Married. Live locally w/ wife.   8 children   Retired.   Enjoys fishing, sports, spending time with family.   Walks and does some light weightlifting at home 3 times a week.   Social Determinants of Health   Financial Resource Strain: Not on file  Food Insecurity: Not on file  Transportation Needs: Not on file  Physical Activity: Not on file  Stress: Not on file  Social Connections: Not on file  Intimate Partner Violence: Not on file     Review of Systems    General:  No chills, fever, night sweats or weight changes.  Cardiovascular: He does not typically experience any angina but did have fleeting sharp chest pain last night.  He denies, dyspnea on exertion with the exception of when he is walking up steep inclines or stairs.  No edema, orthopnea, palpitations, paroxysmal nocturnal dyspnea. Dermatological: No rash, lesions/masses Respiratory: No cough, +++ dyspnea with inclines and stairs. Urologic: No hematuria, dysuria Abdominal:   No nausea, vomiting, diarrhea, bright red blood per rectum, melena, or hematemesis Neurologic:  No visual changes, wkns, changes in mental status. All other systems reviewed and are otherwise negative except as noted above.  Physical Exam    VS:  BP 134/76   Pulse 61   Ht 5\' 8"  (1.727 m)   Wt 184 lb 12.8 oz (83.8 kg)   SpO2 95%   BMI 28.10 kg/m  , BMI Body mass index is 28.1 kg/m.     GEN: Well nourished, well developed, in no acute distress. HEENT: normal. Neck: Supple, no JVD, carotid bruits, or masses. Cardiac: RRR, no murmurs, rubs, or gallops. No clubbing, cyanosis, edema.  Radials/PT 2+ and equal bilaterally.  Respiratory:  Respirations regular and unlabored, diminished breath sounds bilaterally. GI: Soft, nontender, nondistended, BS + x 4. MS: no deformity or atrophy. Skin: warm and dry, no rash. Neuro:  Strength and sensation are intact. Psych: Normal affect.  Accessory Clinical Findings    ECG personally  reviewed by me today-regular sinus rhythm, 61, left axis deviation, left anterior fascicular block, right bundle branch block, LVH- No acute changes  Lab Results  Component Value Date   WBC 7.4 04/01/2021   HGB 12.6 (L) 04/01/2021   HCT 37.3 (L) 04/01/2021   MCV 86.4 04/01/2021   PLT 218.0 04/01/2021   Lab Results  Component Value Date   CREATININE 0.95 07/27/2020   BUN 21 07/27/2020   NA 139 07/27/2020   K 3.8 07/27/2020   CL 101 07/27/2020   CO2 32 07/27/2020   Lab Results  Component Value Date   ALT 7 07/27/2020   AST 13 07/27/2020   ALKPHOS 74 07/27/2020   BILITOT 0.6 07/27/2020   Lab Results  Component Value Date   CHOL 153 10/14/2020   HDL 51.60 10/14/2020   LDLCALC 79 10/14/2020   TRIG 109.0 10/14/2020   CHOLHDL 3 10/14/2020    Lab Results  Component Value Date   HGBA1C 5.9 07/27/2020    Assessment & Plan   1.  Atypical chest pain/coronary artery disease: Status post prior LAD stenting in 2005 with repeat stenting secondary to in-stent restenosis in 2015.  He has done well over the years without angina.  He awoke last night with sharp left lower chest pain without associated symptoms lasting about 3 to 4 seconds, and resolving spontaneously.  He has had no recurrent symptoms.  His chest wall is nontender.  Prior anginal equivalent was heavy chest pressure and dyspnea.  We discussed the atypical nature of his current symptoms as well as potential role of ischemic testing.  Given atypical and brief nature, we mutually agreed to forego ischemic testing at this time.  He otherwise remains relatively active, exercising about 3 times per week without symptoms or limitations.  I encouraged him to try and increase to 5 days/week.  He remains on statin, beta-blocker, and ACE inhibitor therapy.  He has not been taking aspirin and I encouraged him to begin taking 81 mg daily, to which he was agreeable.  2.  Essential hypertension: Blood pressure mildly elevated today.   Pressures typically 1  teens to 130s at prior office visits.  Encouraged him to follow his blood pressure at home.  Would titrate lisinopril if pressure trends are typically greater than 130 mmHg.  3.  Hyperlipidemia: LDL of 79 in January 2022.  He remains on high-dose statin therapy.  We discussed a goal LDL of less than 70 in the setting of coronary artery disease.  We could consider the addition of Zetia however, I would like for him to increase his exercise from 3 days to at least 5 days a week first and will await follow-up lipids early next year.  4.  Tobacco abuse: For the most part, he smokes about a pack a day.  He is not interested in any cessation aids but recognizes that he needs to quit.  We discussed that he previously underwent CT of the chest for lung cancer screening in 2020, with recommendation for annual follow-up.  He is interested in a pulmonology referral to resume annual screening.  Complete smoking cessation advised.  5.  Disposition: Follow-up in clinic in 4 to 6 months or sooner if necessary.  Murray Hodgkins, NP 07/01/2021, 5:04 PM

## 2021-08-18 ENCOUNTER — Other Ambulatory Visit: Payer: Self-pay

## 2021-08-18 DIAGNOSIS — Z87891 Personal history of nicotine dependence: Secondary | ICD-10-CM

## 2021-08-18 DIAGNOSIS — F17210 Nicotine dependence, cigarettes, uncomplicated: Secondary | ICD-10-CM

## 2021-08-18 DIAGNOSIS — F17200 Nicotine dependence, unspecified, uncomplicated: Secondary | ICD-10-CM

## 2021-08-26 ENCOUNTER — Encounter: Payer: Self-pay | Admitting: Acute Care

## 2021-08-31 ENCOUNTER — Other Ambulatory Visit: Payer: Self-pay | Admitting: Primary Care

## 2021-08-31 DIAGNOSIS — E785 Hyperlipidemia, unspecified: Secondary | ICD-10-CM

## 2021-08-31 DIAGNOSIS — I10 Essential (primary) hypertension: Secondary | ICD-10-CM

## 2021-08-31 DIAGNOSIS — G8929 Other chronic pain: Secondary | ICD-10-CM

## 2021-09-14 ENCOUNTER — Other Ambulatory Visit: Payer: Self-pay | Admitting: Primary Care

## 2021-09-14 DIAGNOSIS — I10 Essential (primary) hypertension: Secondary | ICD-10-CM

## 2021-09-22 ENCOUNTER — Other Ambulatory Visit: Payer: Self-pay | Admitting: Primary Care

## 2021-09-22 DIAGNOSIS — E785 Hyperlipidemia, unspecified: Secondary | ICD-10-CM

## 2021-09-28 ENCOUNTER — Other Ambulatory Visit: Payer: Self-pay | Admitting: Primary Care

## 2021-09-28 DIAGNOSIS — I10 Essential (primary) hypertension: Secondary | ICD-10-CM

## 2021-10-20 ENCOUNTER — Other Ambulatory Visit: Payer: Self-pay | Admitting: Primary Care

## 2021-10-20 DIAGNOSIS — I10 Essential (primary) hypertension: Secondary | ICD-10-CM

## 2021-10-20 DIAGNOSIS — E785 Hyperlipidemia, unspecified: Secondary | ICD-10-CM

## 2021-10-21 NOTE — Telephone Encounter (Signed)
Overdue for follow up and must be scheduled for further refills. Needs CPE/follow up.

## 2021-10-23 ENCOUNTER — Other Ambulatory Visit: Payer: Self-pay | Admitting: Primary Care

## 2021-10-23 DIAGNOSIS — I10 Essential (primary) hypertension: Secondary | ICD-10-CM

## 2021-10-26 NOTE — Telephone Encounter (Signed)
Called pt but not able to leave message

## 2021-10-27 NOTE — Telephone Encounter (Signed)
2nd attempt Unable to lm to schedule   

## 2021-10-30 NOTE — Progress Notes (Deleted)
Cardiology Office Note  Date:  10/30/2021   ID:  Bryan Calderon, Bryan Calderon 07/02/1946, MRN [id number]  PCP:  Pleas Koch, NP   No chief complaint on file.   HPI:   [name] is a pleasant 76 year-old gentleman with history of  Coronary artery disease LAD stent and years ago, repeat stent with DES XIENCE 3.0 x 23 mm to the LAD for in-stent restenosis October 2015,   hyperlipidemia, hypertension Who presents for follow-up of her coronary disease  Last seen in clinic by one of our providers September 2022, prior to that was seen by myself in clinic December 2016  On visit September 2022 reported having chest pain Chest pain felt to be atypical  Smokes 1 pack/day    Reports that he continues to smoke one half pack per day, has been smoking for many decades. Has been trying to quit but having difficulty. 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  Weight has been trending upwards, retired, needs more hobbies. No regular exercise program but does try to stay active. Denies any chest pain or shortness of breath concerning for angina. Previously had chest pain prior to his stent, no recent symptoms.  Lab work reviewed with him showing well-controlled cholesterol in the 120 range, LDL in the 50 range No diabetes  He would like to stop his Effient as it is very expensive for him He prefers to stay on aspirin. In fact has not been taking Effient for 1 month He reports his blood pressures relatively well-controlled at home  Review of cardiac catheterization with him from 08/03/2014 shows a 90% proximal to mid LAD lesion with stent placed, 0 residual stenosis, 20% left circumflex disease, 3240% mid and distal RCA disease, 40 and 60% OM1 and OM 3 disease  EKG on today's visit shows normal sinus rhythm with rate 56 bpm, nonspecific T wave abnormality anterolateral leads,II, III,  and aVF EKG is actually improved compared to 2010   PMH:   has a past medical history of Alcohol  abuse, Coronary artery disease, GERD (gastroesophageal reflux disease), Heart disease, Hyperlipidemia, and Hypertension.  PSH:    Past Surgical History:  Procedure Laterality Date   APPENDECTOMY     CARDIAC CATHETERIZATION  08/03/2014   CARDIAC CATHETERIZATION  10/10/2003   [place], [place]    COLONOSCOPY WITH PROPOFOL N/A 11/30/2020   Procedure: COLONOSCOPY WITH PROPOFOL;  Surgeon: Jonathon Bellows, MD;  Location: [facility] ENDOSCOPY;  Service: Gastroenterology;  Laterality: N/A;   CORONARY ANGIOPLASTY WITH STENT PLACEMENT  08/03/2014   CORONARY ANGIOPLASTY WITH STENT PLACEMENT  10/10/2003   [place], [place]    SHOULDER SURGERY Bilateral    Rotator Cuff Repair    Current Outpatient Medications  Medication Sig Dispense Refill   aspirin EC 81 MG tablet Take 1 tablet (81 mg total) by mouth daily. Swallow whole. 90 tablet 3   atorvastatin (LIPITOR) 80 MG tablet TAKE 1 TABLET (80 MG) BY MOUTH DAILY. FOR CHOLESTEROL. OFFICE VISIT REQUIRED FOR FURTHER REFILLS. 30 tablet 0   diclofenac (VOLTAREN) 75 MG EC tablet Take 1 tablet (75 mg total) by mouth 2 (two) times daily as needed for moderate pain. Office visit required for further refills. 15 tablet 0   hydrochlorothiazide (HYDRODIURIL) 25 MG tablet TAKE 1 TABLET BY MOUTH DAILY FOR SWELLING AND BLOOD PRESSURE. OFFICE VISIT REQUIRED FOR FURTHER REFILLS. 30 tablet 0   lisinopril (ZESTRIL) 10 MG tablet TAKE 1 TABLET (10 MG TOTAL) BY MOUTH DAILY. FOR BLOOD PRESSURE. OFFICE VISIT REQUIRED IN [place]  2023 FOR FURTHER REFILLS. 30 tablet 0   metoprolol tartrate (LOPRESSOR) 50 MG tablet TAKE 1 TABLET BY MOUTH TWICE A DAY for blood pressure. 180 tablet 0   nitroGLYCERIN (NITROSTAT) 0.4 MG SL tablet TAKE 1 TABLET EVERY 5 MINS up to three doses as needed for chest pain. 30 tablet 0   vitamin E 400 UNIT capsule Take 400 Units by mouth. Taking 3 times a week     No current facility-administered medications for this visit.     Allergies:   Patient has no known allergies.    Social History:  The patient  reports that he has been smoking cigarettes. He has a 35.00 pack-year smoking history. He has never used smokeless tobacco. He reports current alcohol use. He reports that he does not use drugs.   Family History:   family history includes Heart attack in his maternal grandmother; Heart disease in his maternal grandmother.    Review of Systems: ROS   PHYSICAL EXAM: VS:  There were no vitals taken for this visit. , BMI There is no height or weight on file to calculate BMI. GEN: Well nourished, well developed, in no acute distress HEENT: normal Neck: no JVD, carotid bruits, or masses Cardiac: RRR; no murmurs, rubs, or gallops,no edema  Respiratory:  clear to auscultation bilaterally, normal work of breathing GI: soft, nontender, nondistended, + BS MS: no deformity or atrophy Skin: warm and dry, no rash Neuro:  Strength and sensation are intact Psych: euthymic mood, full affect    Recent Labs: 04/01/2021: Hemoglobin 12.6; Platelets 218.0    Lipid Panel Lab Results  Component Value Date   CHOL 153 10/14/2020   HDL 51.60 10/14/2020   LDLCALC 79 10/14/2020   TRIG 109.0 10/14/2020      Wt Readings from Last 3 Encounters:  07/01/21 184 lb 12.8 oz (83.8 kg)  04/20/21 195 lb 12 oz (88.8 kg)  04/01/21 192 lb (87.1 kg)       ASSESSMENT AND PLAN:  Problem List Items Addressed This Visit   None    Disposition:   F/U  12 months   Total encounter time more than 25 minutes  Greater than 50% was spent in counseling and coordination of care with the patient    Signed, Esmond Plants, M.D., Ph.D. [place], [facility]

## 2021-10-31 ENCOUNTER — Ambulatory Visit: Payer: Federal, State, Local not specified - PPO | Admitting: Cardiovascular Disease

## 2021-10-31 DIAGNOSIS — I25118 Atherosclerotic heart disease of native coronary artery with other forms of angina pectoris: Secondary | ICD-10-CM

## 2021-10-31 DIAGNOSIS — E785 Hyperlipidemia, unspecified: Secondary | ICD-10-CM

## 2021-10-31 DIAGNOSIS — I10 Essential (primary) hypertension: Secondary | ICD-10-CM

## 2021-10-31 DIAGNOSIS — Z720 Tobacco use: Secondary | ICD-10-CM

## 2021-11-01 ENCOUNTER — Encounter: Payer: Self-pay | Admitting: Cardiovascular Disease

## 2021-11-21 ENCOUNTER — Other Ambulatory Visit: Payer: Self-pay | Admitting: Primary Care

## 2021-11-21 DIAGNOSIS — E785 Hyperlipidemia, unspecified: Secondary | ICD-10-CM

## 2021-11-21 DIAGNOSIS — I10 Essential (primary) hypertension: Secondary | ICD-10-CM

## 2021-11-22 NOTE — Telephone Encounter (Signed)
Pt scheduled a f/u appointment on the 21st

## 2021-11-22 NOTE — Telephone Encounter (Signed)
Patient is overdue to see me for follow up. Needs to be scheduled ASAP, any slot for follow up. Please notify me when this has been done.

## 2021-11-29 ENCOUNTER — Ambulatory Visit: Payer: Federal, State, Local not specified - PPO | Admitting: Primary Care

## 2021-11-29 ENCOUNTER — Other Ambulatory Visit: Payer: Self-pay

## 2021-11-29 ENCOUNTER — Encounter: Payer: Self-pay | Admitting: Primary Care

## 2021-11-29 VITALS — BP 138/62 | HR 51 | Temp 98.6°F | Ht 68.0 in | Wt 197.0 lb

## 2021-11-29 DIAGNOSIS — R7303 Prediabetes: Secondary | ICD-10-CM | POA: Diagnosis not present

## 2021-11-29 DIAGNOSIS — Z122 Encounter for screening for malignant neoplasm of respiratory organs: Secondary | ICD-10-CM

## 2021-11-29 DIAGNOSIS — I10 Essential (primary) hypertension: Secondary | ICD-10-CM | POA: Diagnosis not present

## 2021-11-29 DIAGNOSIS — E785 Hyperlipidemia, unspecified: Secondary | ICD-10-CM

## 2021-11-29 DIAGNOSIS — Z87891 Personal history of nicotine dependence: Secondary | ICD-10-CM

## 2021-11-29 DIAGNOSIS — Z125 Encounter for screening for malignant neoplasm of prostate: Secondary | ICD-10-CM

## 2021-11-29 DIAGNOSIS — J432 Centrilobular emphysema: Secondary | ICD-10-CM

## 2021-11-29 DIAGNOSIS — I2510 Atherosclerotic heart disease of native coronary artery without angina pectoris: Secondary | ICD-10-CM

## 2021-11-29 MED ORDER — LISINOPRIL 20 MG PO TABS: 20.0000 mg | ORAL_TABLET | Freq: Every day | ORAL | 3 refills | Status: DC

## 2021-11-29 NOTE — Progress Notes (Signed)
Subjective:    [name], male    DOB: [birth year], 76 y.o.   MRN: [id number]  HPI  Bryan Calderon is a very pleasant 76 y.o. male with a history of CAD, hypertension, COPD, hyperlipidemia, tobacco abuse, prediabetes who presents today for follow up of chronic conditions.  1) Hyperlipidemia/CAD: Currently managed on atorvastatin 80 mg.  Following with cardiology, last visit was in September 2022.  During this visit he was encouraged to resume aspirin 81 mg daily.  2) Essential Hypertension: Currently managed on lisinopril 10 mg daily, hydrochlorothiazide 25 mg daily, metoprolol tartrate 50 mg daily.  Evaluated by cardiology in September 2022, recommendations were to do titrate up lisinopril if systolic BPs continued to remain above 130.  He is checking his BP at home, doesn't recall the readings. He denies chest pain, dizziness, headaches.   BP Readings from Last 3 Encounters:  11/29/21 138/62  07/01/21 134/76  04/20/21 130/76   3) COPD: Smoker for years. Referred to lung cancer screening program in 2020, last CT chest was in September 2020, recommendations were to repeat CT chest in September 2021.  He has yet to repeat his CT.  He continues to smoke cigarettes daily. He does experience dyspnea when walking up stairs. He denies dyspnea when walking his dog and doing mild household chores.      Review of Systems  Constitutional:  Negative for fatigue.  Respiratory:  Positive for shortness of breath.   Cardiovascular:  Negative for chest pain.  Gastrointestinal:  Negative for constipation and diarrhea.  Neurological:  Negative for dizziness and headaches.  Psychiatric/Behavioral:  The patient is not nervous/anxious.         Past Medical History:  Diagnosis Date   Alcohol abuse    Coronary artery disease    a. ~ 2005 s/p PCI to the LAD in [place], [place]; b. 07/2014 PCI: LM nl, LAD 90 ISR (3.0x23 Xience Alpine DES), LCX 62m, OM1 40, OM3 60, RCA 29m,  40d, RPL1 30. EF 55%.   GERD (gastroesophageal reflux disease)    Heart disease    Hyperlipidemia    Hypertension     Social History   Socioeconomic History   Marital status: Married    Spouse name: Not on file   Number of children: Not on file   Years of education: Not on file   Highest education level: Not on file  Occupational History   Not on file  Tobacco Use   Smoking status: Some Days    Packs/day: 1.00    Years: 35.00    Pack years: 35.00    Types: Cigarettes   Smokeless tobacco: Never   Tobacco comments:    about 3 cigarettes a day  Substance and Sexual Activity   Alcohol use: Yes    Comment: weekend socially - 8 oz of liquor w/ mixed drinks over an occasional weekend   Drug use: Never   Sexual activity: Not on file  Other Topics Concern   Not on file  Social History Narrative   Married. Live locally w/ wife.   8 children   Retired.   Enjoys fishing, sports, spending time with family.   Walks and does some light weightlifting at home 3 times a week.   Social Determinants of Health   Financial Resource Strain: Not on file  Food Insecurity: Not on file  Transportation Needs: Not on file  Physical Activity: Not on file  Stress: Not on file  Social Connections: Not  on file  Intimate Partner Violence: Not on file    Past Surgical History:  Procedure Laterality Date   APPENDECTOMY     CARDIAC CATHETERIZATION  08/03/2014   CARDIAC CATHETERIZATION  10/10/2003   [place], [place]    COLONOSCOPY WITH PROPOFOL N/A 11/30/2020   Procedure: COLONOSCOPY WITH PROPOFOL;  Surgeon: Jonathon Bellows, MD;  Location: [facility] ENDOSCOPY;  Service: Gastroenterology;  Laterality: N/A;   CORONARY ANGIOPLASTY WITH STENT PLACEMENT  08/03/2014   CORONARY ANGIOPLASTY WITH STENT PLACEMENT  10/10/2003   [place], [place]    SHOULDER SURGERY Bilateral    Rotator Cuff Repair    Family History  Problem Relation Age of Onset   Heart disease Maternal Grandmother    Heart attack Maternal  Grandmother     No Known Allergies  Current Outpatient Medications on File Prior to Visit  Medication Sig Dispense Refill   aspirin EC 81 MG tablet Take 1 tablet (81 mg total) by mouth daily. Swallow whole. 90 tablet 3   atorvastatin (LIPITOR) 80 MG tablet TAKE 1 TABLET (80 MG) BY MOUTH DAILY. FOR CHOLESTEROL. OFFICE VISIT REQUIRED FOR FURTHER REFILLS. 30 tablet 0   diclofenac (VOLTAREN) 75 MG EC tablet Take 1 tablet (75 mg total) by mouth 2 (two) times daily as needed for moderate pain. Office visit required for further refills. 15 tablet 0   hydrochlorothiazide (HYDRODIURIL) 25 MG tablet TAKE 1 TABLET BY MOUTH DAILY FOR SWELLING AND BLOOD PRESSURE. OFFICE VISIT REQUIRED FOR REFILLS 30 tablet 0   lisinopril (ZESTRIL) 10 MG tablet Take 1 tablet (10 mg total) by mouth daily. For blood pressure. Office visit required for further refills. 30 tablet 0   metoprolol tartrate (LOPRESSOR) 50 MG tablet TAKE 1 TABLET BY MOUTH TWICE A DAY for blood pressure. 180 tablet 0   nitroGLYCERIN (NITROSTAT) 0.4 MG SL tablet TAKE 1 TABLET EVERY 5 MINS up to three doses as needed for chest pain. 30 tablet 0   vitamin E 400 UNIT capsule Take 400 Units by mouth. Taking 3 times a week     No current facility-administered medications on file prior to visit.    BP 138/62    Pulse (!) 51    Temp 98.6 F (37 C) (Oral)    Ht 5\' 8"  (1.727 m)    Wt 197 lb (89.4 kg)    SpO2 97%    BMI 29.95 kg/m  Objective:   Physical Exam Cardiovascular:     Rate and Rhythm: Normal rate and regular rhythm.  Pulmonary:     Effort: Pulmonary effort is normal.     Breath sounds: Normal breath sounds. No wheezing or rales.  Musculoskeletal:     Cervical back: Neck supple.  Skin:    General: Skin is warm and dry.  Neurological:     Mental Status: He is alert and oriented to person, place, and time.  Psychiatric:        Mood and Affect: Mood normal.          Assessment & Plan:      This visit occurred during the SARS-CoV-2  public health emergency.  Safety protocols were in place, including screening questions prior to the visit, additional usage of staff PPE, and extensive cleaning of exam room while observing appropriate contact time as indicated for disinfecting solutions.

## 2021-11-29 NOTE — Assessment & Plan Note (Signed)
Stable, no concerns today.  Referral placed back for lung cancer screening program as he is due.

## 2021-11-29 NOTE — Assessment & Plan Note (Signed)
Discussed the importance of a healthy diet and regular exercise in order for weight loss, and to reduce the risk of further co-morbidity. ? ?Repeat A1C pending. ?

## 2021-11-29 NOTE — Assessment & Plan Note (Signed)
Asymptomatic. Following with cardiology, office notes from September 2022 reviewed.  Continue aspirin 81 mg daily, lipid control, blood pressure control, as needed nitroglycerin.

## 2021-11-29 NOTE — Assessment & Plan Note (Signed)
Continue atorvastatin 80 mg daily. Repeat lipid panel pending. 

## 2021-11-29 NOTE — Patient Instructions (Addendum)
You will be contacted regarding your referral to lung cancer screening.  Please let us know if you have not been contacted within two weeks.   Come by tomorrow for labs.  We increased your lisinopril blood pressure medication to 20 mg.  Take 1 tablet by mouth once daily.  I sent a new prescription to your pharmacy.  Continue all other medications as prescribed.  We will see you in a few weeks for blood pressure check.  It was a pleasure to see you today!

## 2021-11-29 NOTE — Assessment & Plan Note (Addendum)
Above goal today, also on prior visits.  Home readings seem to be about the same.  Blood pressure goal of less than 130/90  Increase lisinopril to 20 mg daily, new prescription sent to pharmacy.  We will plan to see him back in 2 weeks for blood pressure follow-up.  Continue hydrochlorothiazide 25 mg daily, metoprolol tartrate 50 mg twice daily.  CMP pending.

## 2021-11-30 ENCOUNTER — Other Ambulatory Visit (INDEPENDENT_AMBULATORY_CARE_PROVIDER_SITE_OTHER): Payer: Federal, State, Local not specified - PPO

## 2021-11-30 DIAGNOSIS — Z125 Encounter for screening for malignant neoplasm of prostate: Secondary | ICD-10-CM

## 2021-11-30 DIAGNOSIS — E785 Hyperlipidemia, unspecified: Secondary | ICD-10-CM | POA: Diagnosis not present

## 2021-11-30 DIAGNOSIS — I10 Essential (primary) hypertension: Secondary | ICD-10-CM | POA: Diagnosis not present

## 2021-11-30 DIAGNOSIS — R7303 Prediabetes: Secondary | ICD-10-CM

## 2021-11-30 LAB — LIPID PANEL
Cholesterol: 133 mg/dL (ref 0–200)
HDL: 50.1 mg/dL (ref >39.00)
LDL Cholesterol: 63 mg/dL (ref 0–99)
NonHDL: 83.28
Total CHOL/HDL Ratio: 3
Triglycerides: 100 mg/dL (ref 0.0–149.0)
VLDL: 20 mg/dL (ref 0.0–40.0)

## 2021-11-30 LAB — COMPREHENSIVE METABOLIC PANEL
ALT: 11 U/L (ref 0–53)
AST: 15 U/L (ref 0–37)
Albumin: 4.1 g/dL (ref 3.5–5.2)
Alkaline Phosphatase: 66 U/L (ref 39–117)
BUN: 20 mg/dL (ref 6–23)
CO2: 34 mEq/L — ABNORMAL HIGH (ref 19–32)
Calcium: 9.3 mg/dL (ref 8.4–10.5)
Chloride: 102 mEq/L (ref 96–112)
Creatinine, Ser: 0.79 mg/dL (ref 0.40–1.50)
GFR: 87.14 mL/min (ref >60.00)
Glucose, Bld: 107 mg/dL — ABNORMAL HIGH (ref 70–99)
Potassium: 3.5 mEq/L (ref 3.5–5.1)
Sodium: 141 mEq/L (ref 135–145)
Total Bilirubin: 0.7 mg/dL (ref 0.2–1.2)
Total Protein: 6.7 g/dL (ref 6.0–8.3)

## 2021-11-30 LAB — CBC
HCT: 38.1 % — ABNORMAL LOW (ref 39.0–52.0)
Hemoglobin: 12.4 g/dL — ABNORMAL LOW (ref 13.0–17.0)
MCHC: 32.6 g/dL (ref 30.0–36.0)
MCV: 86.3 fl (ref 78.0–100.0)
Platelets: 218 10*3/uL (ref 150.0–400.0)
RBC: 4.42 Mil/uL (ref 4.22–5.81)
RDW: 15.8 % — ABNORMAL HIGH (ref 11.5–15.5)
WBC: 9.5 10*3/uL (ref 4.0–10.5)

## 2021-11-30 LAB — HEMOGLOBIN A1C: Hgb A1c MFr Bld: 6 % (ref 4.6–6.5)

## 2021-11-30 LAB — PSA, MEDICARE: PSA: 2.74 ng/mL (ref 0.10–4.00)

## 2021-12-05 ENCOUNTER — Other Ambulatory Visit: Payer: Self-pay | Admitting: *Deleted

## 2021-12-15 ENCOUNTER — Ambulatory Visit: Payer: Federal, State, Local not specified - PPO | Attending: Acute Care

## 2021-12-22 ENCOUNTER — Other Ambulatory Visit: Payer: Self-pay | Admitting: Primary Care

## 2021-12-22 DIAGNOSIS — I10 Essential (primary) hypertension: Secondary | ICD-10-CM

## 2021-12-22 DIAGNOSIS — E785 Hyperlipidemia, unspecified: Secondary | ICD-10-CM

## 2022-01-15 ENCOUNTER — Other Ambulatory Visit: Payer: Self-pay | Admitting: Primary Care

## 2022-01-15 DIAGNOSIS — I10 Essential (primary) hypertension: Secondary | ICD-10-CM

## 2022-01-20 ENCOUNTER — Other Ambulatory Visit: Payer: Self-pay | Admitting: Primary Care

## 2022-01-20 DIAGNOSIS — I10 Essential (primary) hypertension: Secondary | ICD-10-CM

## 2022-02-03 ENCOUNTER — Other Ambulatory Visit: Payer: Self-pay | Admitting: Primary Care

## 2022-02-03 DIAGNOSIS — I10 Essential (primary) hypertension: Secondary | ICD-10-CM

## 2022-04-19 ENCOUNTER — Other Ambulatory Visit: Payer: Self-pay | Admitting: Primary Care

## 2022-04-19 DIAGNOSIS — I10 Essential (primary) hypertension: Secondary | ICD-10-CM

## 2022-04-20 ENCOUNTER — Other Ambulatory Visit: Payer: Self-pay | Admitting: Primary Care

## 2022-04-20 DIAGNOSIS — I10 Essential (primary) hypertension: Secondary | ICD-10-CM

## 2022-04-24 ENCOUNTER — Other Ambulatory Visit: Payer: Self-pay | Admitting: Primary Care

## 2022-04-24 DIAGNOSIS — I10 Essential (primary) hypertension: Secondary | ICD-10-CM

## 2022-09-08 ENCOUNTER — Other Ambulatory Visit: Payer: Self-pay | Admitting: Primary Care

## 2022-09-08 DIAGNOSIS — I10 Essential (primary) hypertension: Secondary | ICD-10-CM

## 2022-10-31 ENCOUNTER — Telehealth: Payer: Self-pay | Admitting: Primary Care

## 2022-10-31 DIAGNOSIS — I10 Essential (primary) hypertension: Secondary | ICD-10-CM

## 2022-10-31 NOTE — Telephone Encounter (Signed)
Patient is due for CPE/follow up in late February, this will be required prior to any further refills.  Please schedule, thank you!

## 2022-10-31 NOTE — Telephone Encounter (Signed)
Patient has been scheduled

## 2022-10-31 NOTE — Telephone Encounter (Signed)
Spoke to pt, he stated he'd call us back to schedule cpe

## 2022-11-07 ENCOUNTER — Encounter: Payer: Self-pay | Admitting: *Deleted

## 2022-11-29 ENCOUNTER — Ambulatory Visit (INDEPENDENT_AMBULATORY_CARE_PROVIDER_SITE_OTHER): Payer: Federal, State, Local not specified - PPO | Admitting: Primary Care

## 2022-11-29 ENCOUNTER — Encounter: Payer: Self-pay | Admitting: Primary Care

## 2022-11-29 ENCOUNTER — Ambulatory Visit: Payer: Federal, State, Local not specified - PPO | Admitting: Family Medicine

## 2022-11-29 VITALS — BP 122/70 | HR 45 | Temp 97.2°F | Ht 68.0 in | Wt 212.0 lb

## 2022-11-29 DIAGNOSIS — I10 Essential (primary) hypertension: Secondary | ICD-10-CM | POA: Diagnosis not present

## 2022-11-29 DIAGNOSIS — E785 Hyperlipidemia, unspecified: Secondary | ICD-10-CM

## 2022-11-29 DIAGNOSIS — Z23 Encounter for immunization: Secondary | ICD-10-CM | POA: Diagnosis not present

## 2022-11-29 DIAGNOSIS — J432 Centrilobular emphysema: Secondary | ICD-10-CM

## 2022-11-29 DIAGNOSIS — I2510 Atherosclerotic heart disease of native coronary artery without angina pectoris: Secondary | ICD-10-CM | POA: Diagnosis not present

## 2022-11-29 DIAGNOSIS — Z0000 Encounter for general adult medical examination without abnormal findings: Secondary | ICD-10-CM | POA: Diagnosis not present

## 2022-11-29 DIAGNOSIS — R600 Localized edema: Secondary | ICD-10-CM | POA: Diagnosis not present

## 2022-11-29 DIAGNOSIS — R7303 Prediabetes: Secondary | ICD-10-CM

## 2022-11-29 DIAGNOSIS — D649 Anemia, unspecified: Secondary | ICD-10-CM | POA: Diagnosis not present

## 2022-11-29 DIAGNOSIS — Z125 Encounter for screening for malignant neoplasm of prostate: Secondary | ICD-10-CM

## 2022-11-29 LAB — CBC
HCT: 34.6 % — ABNORMAL LOW (ref 39.0–52.0)
Hemoglobin: 11.5 g/dL — ABNORMAL LOW (ref 13.0–17.0)
MCHC: 33.2 g/dL (ref 30.0–36.0)
MCV: 79.9 fl (ref 78.0–100.0)
Platelets: 231 10*3/uL (ref 150.0–400.0)
RBC: 4.33 Mil/uL (ref 4.22–5.81)
RDW: 16 % — ABNORMAL HIGH (ref 11.5–15.5)
WBC: 8.9 10*3/uL (ref 4.0–10.5)

## 2022-11-29 LAB — BRAIN NATRIURETIC PEPTIDE: Pro B Natriuretic peptide (BNP): 115 pg/mL — ABNORMAL HIGH (ref 0.0–100.0)

## 2022-11-29 LAB — HEMOGLOBIN A1C: Hgb A1c MFr Bld: 6.3 % (ref 4.6–6.5)

## 2022-11-29 LAB — PSA, MEDICARE: PSA: 3.94 ng/ml (ref 0.10–4.00)

## 2022-11-29 NOTE — Assessment & Plan Note (Signed)
Immunizations UTD per patient. Influenza vaccine provided today. PSA due and pending. Colonoscopy screening no longer needed per GI. Lung cancer screening due, he will schedule.    Discussed the importance of a healthy diet and regular exercise in order for weight loss, and to reduce the risk of further co-morbidity.  Exam stable. Labs pending.  Follow up in 1 year for repeat physical.

## 2022-11-29 NOTE — Assessment & Plan Note (Signed)
Uncomplicated.  Likely form venous insufficiency. Checking BNP today. Discussed use of compression socks, elevating legs at rest. Encouraged to continue with regular exercise.

## 2022-11-29 NOTE — Assessment & Plan Note (Signed)
Asymptomatic.  Following with cardiology, reviewed office notes from September 2023. Continue lipid and BP control.

## 2022-11-29 NOTE — Assessment & Plan Note (Signed)
Overall controlled.  No concerns today. Continue to monitor.   He will call to schedule his lung cancer screening exam.

## 2022-11-29 NOTE — Progress Notes (Signed)
Subjective:    [name], male    DOB: Dec 17, 1945, 77 y.o.   MRN: [id number]  HPI  Bryan Calderon is a very pleasant 77 y.o. male who presents today for complete physical and follow up of chronic conditions.  Immunizations: -Influenza: Completed today -Shingles: Completed Shingrix series at [facility] -Pneumonia: Completed Prevnar 13 in 2017 and pneumovax 23 in 2018  Diet: Bryan Calderon.  Exercise: Regular exercise as of recent.  Eye exam: Completes annually  Dental exam: Completes semi-annually   Colonoscopy: Completed in 2022, no further screenings recommended per GI. Lung Cancer Screening: Completed in 2020, has not completed since. He plans on scheduling soon.   PSA: Due  BP Readings from Last 3 Encounters:  11/29/22 122/70  11/29/21 138/62  07/01/21 134/76      Review of Systems  Constitutional:  Negative for unexpected weight change.  HENT:  Negative for rhinorrhea.   Respiratory:  Negative for cough and shortness of breath.   Cardiovascular:  Positive for leg swelling. Negative for chest pain.  Gastrointestinal:  Negative for constipation and diarrhea.  Genitourinary:  Negative for difficulty urinating.  Musculoskeletal:  Positive for arthralgias.  Skin:  Negative for rash.  Allergic/Immunologic: Negative for environmental allergies.  Neurological:  Negative for dizziness and headaches.  Psychiatric/Behavioral:  The patient is not nervous/anxious.          Past Medical History:  Diagnosis Date   Alcohol abuse    Coronary artery disease    a. ~ 2005 s/p PCI to the LAD in [place], [place]; b. 07/2014 PCI: LM nl, LAD 90 ISR (3.0x23 Xience Alpine DES), LCX 78m OM1 40, OM3 60, RCA 333m40d, RPL1 30. EF 55%.   GERD (gastroesophageal reflux disease)    Heart disease    Hyperlipidemia    Hypertension    Infected epidermoid cyst 07/30/2020    Social History   Socioeconomic History   Marital status: Married    Spouse name: Not on file    Number of children: Not on file   Years of education: Not on file   Highest education level: Not on file  Occupational History   Not on file  Tobacco Use   Smoking status: Every Day    Packs/day: 1.00    Years: 35.00    Total pack years: 35.00    Types: Cigarettes   Smokeless tobacco: Never   Tobacco comments:    about 3 cigarettes a day  Substance and Sexual Activity   Alcohol use: Yes    Comment: weekend socially - 8 oz of liquor w/ mixed drinks over an occasional weekend   Drug use: Never   Sexual activity: Not on file  Other Topics Concern   Not on file  Social History Narrative   Married. Live locally w/ wife.   8 children   Retired.   Enjoys fishing, sports, spending time with family.   Walks and does some light weightlifting at home 3 times a week.   Social Determinants of Health   Financial Resource Strain: Not on file  Food Insecurity: Not on file  Transportation Needs: Not on file  Physical Activity: Not on file  Stress: Not on file  Social Connections: Not on file  Intimate Partner Violence: Not on file    Past Surgical History:  Procedure Laterality Date   APPENDECTOMY     CARDIAC CATHETERIZATION  08/03/2014   CARDIAC CATHETERIZATION  10/10/2003   [place], [place]    COLONOSCOPY WITH  PROPOFOL N/A 11/30/2020   Procedure: COLONOSCOPY WITH PROPOFOL;  Surgeon: Jonathon Bellows, MD;  Location: [facility] ENDOSCOPY;  Service: Gastroenterology;  Laterality: N/A;   CORONARY ANGIOPLASTY WITH STENT PLACEMENT  08/03/2014   CORONARY ANGIOPLASTY WITH STENT PLACEMENT  10/10/2003   [place], [place]    SHOULDER SURGERY Bilateral    Rotator Cuff Repair    Family History  Problem Relation Age of Onset   Heart disease Maternal Grandmother    Heart attack Maternal Grandmother     No Known Allergies  Current Outpatient Medications on File Prior to Visit  Medication Sig Dispense Refill   aspirin EC 81 MG tablet Take 1 tablet (81 mg total) by mouth daily. Swallow whole. 90 tablet 3    atorvastatin (LIPITOR) 80 MG tablet Take 1 tablet (80 mg total) by mouth daily. for cholesterol. 90 tablet 3   hydrochlorothiazide (HYDRODIURIL) 25 MG tablet TAKE 1 TABLET BY MOUTH DAILY FOR SWELLING AND BLOOD PRESSURE. 90 tablet 3   lisinopril (ZESTRIL) 20 MG tablet TAKE 1 TABLET (20 MG TOTAL) BY MOUTH DAILY. FOR BLOOD PRESSURE. 90 tablet 0   metoprolol tartrate (LOPRESSOR) 50 MG tablet TAKE 1 TABLET BY MOUTH TWICE A DAY FOR BLOOD PRESSURE 180 tablet 2   nitroGLYCERIN (NITROSTAT) 0.4 MG SL tablet TAKE 1 TABLET EVERY 5 MINS up to three doses as needed for chest pain. 30 tablet 0   vitamin E 400 UNIT capsule Take 400 Units by mouth. Taking 3 times a week     No current facility-administered medications on file prior to visit.    BP 122/70   Pulse (!) 45   Temp (!) 97.2 F (36.2 C) (Temporal)   Ht 5' 8"$  (1.727 m)   Wt 212 lb (96.2 kg)   SpO2 99%   BMI 32.23 kg/m  Objective:   Physical Exam HENT:     Right Ear: Tympanic membrane and ear canal normal.     Left Ear: Tympanic membrane and ear canal normal.     Nose: Nose normal.     Right Sinus: No maxillary sinus tenderness or frontal sinus tenderness.     Left Sinus: No maxillary sinus tenderness or frontal sinus tenderness.  Eyes:     Conjunctiva/sclera: Conjunctivae normal.  Neck:     Thyroid: No thyromegaly.     Vascular: No carotid bruit.  Cardiovascular:     Rate and Rhythm: Normal rate and regular rhythm.     Heart sounds: Normal heart sounds.     Comments: Mild bilateral lower extremity edema without pitting Pulmonary:     Effort: Pulmonary effort is normal.     Breath sounds: Normal breath sounds. No wheezing or rales.  Abdominal:     General: Bowel sounds are normal.     Palpations: Abdomen is soft.     Tenderness: There is no abdominal tenderness.  Musculoskeletal:        General: Normal range of motion.     Cervical back: Neck supple.  Skin:    General: Skin is warm and dry.  Neurological:     Mental Status:  He is alert and oriented to person, place, and time.     Cranial Nerves: No cranial nerve deficit.     Deep Tendon Reflexes: Reflexes are normal and symmetric.  Psychiatric:        Mood and Affect: Mood normal.           Assessment & Plan:  Preventative health care Assessment & Plan: Immunizations UTD per patient.  Influenza vaccine provided today. PSA due and pending. Colonoscopy screening no longer needed per GI. Lung cancer screening due, he will schedule.    Discussed the importance of a healthy diet and regular exercise in order for weight loss, and to reduce the risk of further co-morbidity.  Exam stable. Labs pending.  Follow up in 1 year for repeat physical.    Coronary artery disease involving native coronary artery of native heart without angina pectoris Assessment & Plan: Asymptomatic.  Following with cardiology, reviewed office notes from September 2023. Continue lipid and BP control.    Essential hypertension Assessment & Plan: Controlled.  Continue lisinopril 20 daily, HCTZ 25 mg daily, metoprolol 50 mg BID. CMP pending.  Orders: -     Comprehensive metabolic panel  Prediabetes Assessment & Plan: Commended him on starting regular exercise.  Repeat A1C pending.  Orders: -     Hemoglobin A1c  Hyperlipidemia, unspecified hyperlipidemia type Assessment & Plan: Repeat lipid panel pending  Continue atorvastatin 80 mg daily. LDL goal <70.  Orders: -     Lipid panel  Anemia, unspecified type -     CBC -     IBC + Ferritin  Screening for prostate cancer -     PSA, Medicare  Centrilobular emphysema ([place]) Assessment & Plan: Overall controlled.  No concerns today. Continue to monitor.   He will call to schedule his lung cancer screening exam.   Bilateral lower extremity edema Assessment & Plan: Uncomplicated.  Likely form venous insufficiency. Checking BNP today. Discussed use of compression socks, elevating legs at  rest. Encouraged to continue with regular exercise.    Orders: -     Brain natriuretic peptide        Pleas Koch, NP

## 2022-11-29 NOTE — Assessment & Plan Note (Addendum)
Controlled.  Continue lisinopril 20 daily, HCTZ 25 mg daily, metoprolol 50 mg BID. CMP pending.

## 2022-11-29 NOTE — Patient Instructions (Addendum)
Stop by the lab prior to leaving today. I will notify you of your results once received.   Call for your lung cancer screening CT scan.  It was a pleasure to see you today!

## 2022-11-29 NOTE — Assessment & Plan Note (Signed)
Commended him on starting regular exercise.  Repeat A1C pending.

## 2022-11-29 NOTE — Assessment & Plan Note (Signed)
Repeat lipid panel pending  Continue atorvastatin 80 mg daily. LDL goal <70.

## 2022-11-30 ENCOUNTER — Ambulatory Visit: Payer: Federal, State, Local not specified - PPO | Admitting: Family Medicine

## 2022-11-30 ENCOUNTER — Other Ambulatory Visit: Payer: Self-pay | Admitting: Primary Care

## 2022-11-30 DIAGNOSIS — D649 Anemia, unspecified: Secondary | ICD-10-CM

## 2022-11-30 LAB — IBC + FERRITIN
Ferritin: 341.5 ng/mL — ABNORMAL HIGH (ref 22.0–322.0)
Iron: 89 ug/dL (ref 42–165)
Saturation Ratios: 38.8 % (ref 20.0–50.0)
TIBC: 229.6 ug/dL — ABNORMAL LOW (ref 250.0–450.0)
Transferrin: 164 mg/dL — ABNORMAL LOW (ref 212.0–360.0)

## 2022-11-30 LAB — LIPID PANEL
Cholesterol: 139 mg/dL (ref 0–200)
HDL: 54 mg/dL (ref >39.00)
LDL Cholesterol: 69 mg/dL (ref 0–99)
NonHDL: 84.66
Total CHOL/HDL Ratio: 3
Triglycerides: 77 mg/dL (ref 0.0–149.0)
VLDL: 15.4 mg/dL (ref 0.0–40.0)

## 2022-11-30 LAB — COMPREHENSIVE METABOLIC PANEL
ALT: 13 U/L (ref 0–53)
AST: 20 U/L (ref 0–37)
Albumin: 3.9 g/dL (ref 3.5–5.2)
Alkaline Phosphatase: 66 U/L (ref 39–117)
BUN: 13 mg/dL (ref 6–23)
CO2: 26 mEq/L (ref 19–32)
Calcium: 9.1 mg/dL (ref 8.4–10.5)
Chloride: 103 mEq/L (ref 96–112)
Creatinine, Ser: 0.92 mg/dL (ref 0.40–1.50)
GFR: 81.03 mL/min (ref >60.00)
Glucose, Bld: 115 mg/dL — ABNORMAL HIGH (ref 70–99)
Potassium: 3.7 mEq/L (ref 3.5–5.1)
Sodium: 142 mEq/L (ref 135–145)
Total Bilirubin: 0.7 mg/dL (ref 0.2–1.2)
Total Protein: 6 g/dL (ref 6.0–8.3)

## 2022-11-30 NOTE — Progress Notes (Deleted)
    Bryan Bowen T. Bryan Hoeschen, MD, [place] at [facility] [place], [address]  Phone: [phone]  FAX: [phone]  Bryan Calderon - 77 y.o. male  MRN [id number]  Date of Birth: 12/31/1945  Date: 11/30/2022  PCP: Pleas Koch, NP  Referral: Pleas Koch, NP  No chief complaint on file.  Subjective:   Bryan Calderon is a 77 y.o. very pleasant male patient with There is no height or weight on file to calculate BMI. who presents with the following:  He is a very pleasant 77 year old gentleman, he presents with some ongoing left-sided knee pain.    Review of Systems is noted in the HPI, as appropriate  Objective:   There were no vitals taken for this visit.  GEN: No acute distress; alert,appropriate. PULM: Breathing comfortably in no respiratory distress PSYCH: Normally interactive.   Laboratory and Imaging Data:  Assessment and Plan:   ***

## 2022-12-03 NOTE — Progress Notes (Unsigned)
    Bryan Calderon T. Bryan Danielsen, MD, [place] at [facility] [place], [address]  Phone: [phone]  FAX: [phone]  Bryan Calderon - 77 y.o. male  MRN [id number]  Date of Birth: 1946/03/24  Date: 12/04/2022  PCP: Pleas Koch, NP  Referral: Pleas Koch, NP  No chief complaint on file.  Subjective:   Bryan Calderon is a 77 y.o. very Bryan male patient with There is no height or weight on file to calculate BMI. who presents with the following:  The patient presents as a new consultation for left sided knee pain.  I have seen him in the past for right sided knee pain two years ago, and for a frozen shoulder five years ago.     Review of Systems is noted in the HPI, as appropriate  Objective:   There were no vitals taken for this visit.  GEN: No acute distress; alert,appropriate. PULM: Breathing comfortably in no respiratory distress PSYCH: Normally interactive.   Laboratory and Imaging Data:  Assessment and Plan:   ***

## 2022-12-04 ENCOUNTER — Ambulatory Visit: Payer: Federal, State, Local not specified - PPO | Admitting: Family Medicine

## 2022-12-04 ENCOUNTER — Encounter: Payer: Self-pay | Admitting: Family Medicine

## 2022-12-04 ENCOUNTER — Ambulatory Visit (INDEPENDENT_AMBULATORY_CARE_PROVIDER_SITE_OTHER)
Admission: RE | Admit: 2022-12-04 | Discharge: 2022-12-04 | Disposition: A | Discharge status: Home / Self Care | Payer: Federal, State, Local not specified - PPO | Source: Ambulatory Visit | Attending: Family Medicine | Admitting: Family Medicine

## 2022-12-04 VITALS — BP 132/80 | HR 47 | Temp 98.3°F | Ht 68.0 in | Wt 208.4 lb

## 2022-12-04 DIAGNOSIS — M25562 Pain in left knee: Secondary | ICD-10-CM | POA: Diagnosis not present

## 2022-12-04 DIAGNOSIS — D649 Anemia, unspecified: Secondary | ICD-10-CM | POA: Diagnosis not present

## 2022-12-04 MED ORDER — CELECOXIB 200 MG PO CAPS: 200.0000 mg | ORAL_CAPSULE | Freq: Every day | ORAL | 2 refills | Status: DC

## 2022-12-06 ENCOUNTER — Telehealth: Payer: Self-pay | Admitting: Primary Care

## 2022-12-06 NOTE — Telephone Encounter (Signed)
Patient came by and picked up paperwork that was left up front. He stated that something should of been in there regarding something about getting a cancer screening because the swelling in his legs had came back. Please advise. Thank you!

## 2022-12-06 NOTE — Telephone Encounter (Signed)
Yes, this is for the IFOB card.

## 2022-12-06 NOTE — Telephone Encounter (Signed)
Patient picked up IFOB card this afternoon

## 2023-02-23 ENCOUNTER — Other Ambulatory Visit: Payer: Self-pay | Admitting: Primary Care

## 2023-02-23 DIAGNOSIS — I10 Essential (primary) hypertension: Secondary | ICD-10-CM

## 2023-02-23 DIAGNOSIS — E785 Hyperlipidemia, unspecified: Secondary | ICD-10-CM

## 2023-02-27 ENCOUNTER — Other Ambulatory Visit: Payer: Self-pay | Admitting: Primary Care

## 2023-02-27 DIAGNOSIS — I10 Essential (primary) hypertension: Secondary | ICD-10-CM

## 2023-04-02 ENCOUNTER — Other Ambulatory Visit: Payer: Self-pay | Admitting: Primary Care

## 2023-04-02 DIAGNOSIS — I10 Essential (primary) hypertension: Secondary | ICD-10-CM

## 2023-07-27 ENCOUNTER — Ambulatory Visit: Payer: Federal, State, Local not specified - PPO | Admitting: Primary Care

## 2023-10-17 ENCOUNTER — Encounter: Payer: Self-pay | Admitting: Acute Care

## 2023-12-04 ENCOUNTER — Encounter: Payer: Federal, State, Local not specified - PPO | Admitting: Primary Care

## 2023-12-05 ENCOUNTER — Encounter: Payer: Self-pay | Admitting: Primary Care

## 2023-12-23 ENCOUNTER — Other Ambulatory Visit: Payer: Self-pay | Admitting: Family Medicine

## 2023-12-24 NOTE — Telephone Encounter (Signed)
 Last office visit 12/04/2022 with Dr. Patsy Lager for Left Knee Pain.  Last refilled 12/04/2022 for #30 with 2 refills.  Next appt: No future appointments.

## 2024-03-19 ENCOUNTER — Other Ambulatory Visit: Payer: Self-pay | Admitting: Primary Care

## 2024-03-19 DIAGNOSIS — E785 Hyperlipidemia, unspecified: Secondary | ICD-10-CM

## 2024-03-19 DIAGNOSIS — I10 Essential (primary) hypertension: Secondary | ICD-10-CM

## 2024-06-30 ENCOUNTER — Encounter: Payer: Self-pay | Admitting: Primary Care

## 2024-06-30 ENCOUNTER — Other Ambulatory Visit: Payer: Self-pay

## 2024-06-30 ENCOUNTER — Ambulatory Visit (INDEPENDENT_AMBULATORY_CARE_PROVIDER_SITE_OTHER): Admitting: Primary Care

## 2024-06-30 ENCOUNTER — Ambulatory Visit: Payer: Self-pay | Admitting: Primary Care

## 2024-06-30 VITALS — BP 120/72 | HR 50 | Temp 97.2°F | Ht 68.0 in | Wt 187.0 lb

## 2024-06-30 DIAGNOSIS — I2510 Atherosclerotic heart disease of native coronary artery without angina pectoris: Secondary | ICD-10-CM | POA: Diagnosis not present

## 2024-06-30 DIAGNOSIS — F17210 Nicotine dependence, cigarettes, uncomplicated: Secondary | ICD-10-CM

## 2024-06-30 DIAGNOSIS — M25561 Pain in right knee: Secondary | ICD-10-CM

## 2024-06-30 DIAGNOSIS — Z0000 Encounter for general adult medical examination without abnormal findings: Secondary | ICD-10-CM

## 2024-06-30 DIAGNOSIS — Z23 Encounter for immunization: Secondary | ICD-10-CM

## 2024-06-30 DIAGNOSIS — E785 Hyperlipidemia, unspecified: Secondary | ICD-10-CM | POA: Diagnosis not present

## 2024-06-30 DIAGNOSIS — Z125 Encounter for screening for malignant neoplasm of prostate: Secondary | ICD-10-CM

## 2024-06-30 DIAGNOSIS — D649 Anemia, unspecified: Secondary | ICD-10-CM

## 2024-06-30 DIAGNOSIS — Z122 Encounter for screening for malignant neoplasm of respiratory organs: Secondary | ICD-10-CM

## 2024-06-30 DIAGNOSIS — R7303 Prediabetes: Secondary | ICD-10-CM

## 2024-06-30 DIAGNOSIS — I10 Essential (primary) hypertension: Secondary | ICD-10-CM | POA: Diagnosis not present

## 2024-06-30 DIAGNOSIS — Z87891 Personal history of nicotine dependence: Secondary | ICD-10-CM

## 2024-06-30 DIAGNOSIS — G8929 Other chronic pain: Secondary | ICD-10-CM

## 2024-06-30 DIAGNOSIS — J432 Centrilobular emphysema: Secondary | ICD-10-CM | POA: Diagnosis not present

## 2024-06-30 DIAGNOSIS — F17200 Nicotine dependence, unspecified, uncomplicated: Secondary | ICD-10-CM

## 2024-06-30 LAB — PSA, MEDICARE: PSA: 3.21 ng/mL (ref 0.10–4.00)

## 2024-06-30 LAB — CBC
HCT: 39.5 % (ref 39.0–52.0)
Hemoglobin: 13 g/dL (ref 13.0–17.0)
MCHC: 32.8 g/dL (ref 30.0–36.0)
MCV: 80.2 fl (ref 78.0–100.0)
Platelets: 254 K/uL (ref 150.0–400.0)
RBC: 4.93 Mil/uL (ref 4.22–5.81)
RDW: 15.9 % — ABNORMAL HIGH (ref 11.5–15.5)
WBC: 7.1 K/uL (ref 4.0–10.5)

## 2024-06-30 LAB — LIPID PANEL
Cholesterol: 145 mg/dL (ref 0–200)
HDL: 45.8 mg/dL (ref >39.00)
LDL Cholesterol: 84 mg/dL (ref 0–99)
NonHDL: 99.24
Total CHOL/HDL Ratio: 3
Triglycerides: 75 mg/dL (ref 0.0–149.0)
VLDL: 15 mg/dL (ref 0.0–40.0)

## 2024-06-30 LAB — COMPREHENSIVE METABOLIC PANEL WITH GFR
ALT: 8 U/L (ref 0–53)
AST: 13 U/L (ref 0–37)
Albumin: 4.3 g/dL (ref 3.5–5.2)
Alkaline Phosphatase: 67 U/L (ref 39–117)
BUN: 17 mg/dL (ref 6–23)
CO2: 31 meq/L (ref 19–32)
Calcium: 9.9 mg/dL (ref 8.4–10.5)
Chloride: 103 meq/L (ref 96–112)
Creatinine, Ser: 0.86 mg/dL (ref 0.40–1.50)
GFR: 83.41 mL/min (ref >60.00)
Glucose, Bld: 122 mg/dL — ABNORMAL HIGH (ref 70–99)
Potassium: 3.8 meq/L (ref 3.5–5.1)
Sodium: 141 meq/L (ref 135–145)
Total Bilirubin: 0.6 mg/dL (ref 0.2–1.2)
Total Protein: 6.8 g/dL (ref 6.0–8.3)

## 2024-06-30 LAB — FERRITIN: Ferritin: 344.6 ng/mL — ABNORMAL HIGH (ref 22.0–322.0)

## 2024-06-30 LAB — HEMOGLOBIN A1C: Hgb A1c MFr Bld: 6.3 % (ref 4.6–6.5)

## 2024-06-30 MED ORDER — ALBUTEROL SULFATE HFA 108 (90 BASE) MCG/ACT IN AERS: 2.0000 | INHALATION_SPRAY | RESPIRATORY_TRACT | 0 refills | Status: AC | PRN

## 2024-06-30 NOTE — Assessment & Plan Note (Signed)
 Controlled.  Continue hydrochlorothiazide  25 mg daily, lisinopril  20 mg daily. CMP pending.

## 2024-06-30 NOTE — Assessment & Plan Note (Signed)
Repeat A1C pending. Commended him on dietary changes.

## 2024-06-30 NOTE — Addendum Note (Signed)
 Addended by: ISADORA RAISIN on: 06/30/2024 04:22 PM   Modules accepted: Orders

## 2024-06-30 NOTE — Assessment & Plan Note (Signed)
 Immunizations UTD. Influenza vaccine provided today.  Colonoscopy UTD, no further screening given age. PSA due and pending.  Discussed the importance of a healthy diet and regular exercise in order for weight loss, and to reduce the risk of further co-morbidity.  Exam stable. Labs pending.  Follow up in 1 year for repeat physical.

## 2024-06-30 NOTE — Progress Notes (Signed)
 Subjective:    [name], male    DOB: [birth year], 78 y.o.   MRN: [id number]  Bryan Calderon is a very pleasant 78 y.o. male who presents today for complete physical and follow up of chronic conditions.  Immunizations: -Influenza: Influenza vaccine provided today.  -Shingles: Completed Shingrix series -Pneumonia: Completed Prevnar 13 in 2017, completed in pneumovax 23 in 2018  Diet: Fair diet. He is working to reduce his portion sizes, is now eating 4 small meals per day. He is grieving the loss of multiple family members, has noticed a reduced appetite.  Exercise: No regular exercise.  Eye exam: Completes >1 year ago Dental exam: Completes semi-annually    Colonoscopy: Completed in 2022, no further screening given age Lung Cancer Screening: Completed in 2020  PSA: Due  BP Readings from Last 3 Encounters:  06/30/24 120/72  12/04/22 132/80  11/29/22 122/70    Wt Readings from Last 3 Encounters:  06/30/24 187 lb (84.8 kg)  12/04/22 208 lb 6 oz (94.5 kg)  11/29/22 212 lb (96.2 kg)        Review of Systems  Constitutional:  Negative for unexpected weight change.  HENT:  Negative for rhinorrhea.   Respiratory:  Negative for cough and shortness of breath.   Cardiovascular:  Negative for chest pain.  Gastrointestinal:  Negative for constipation and diarrhea.  Genitourinary:  Negative for difficulty urinating.  Musculoskeletal:  Negative for arthralgias and myalgias.  Skin:  Negative for rash.  Allergic/Immunologic: Negative for environmental allergies.  Neurological:  Negative for dizziness and headaches.  Psychiatric/Behavioral:         Grieving the loss of multiple family members         Past Medical History:  Diagnosis Date   Alcohol abuse    Coronary artery disease    a. ~ 2005 s/p PCI to the LAD in [place], [place]; b. 07/2014 PCI: LM nl, LAD 90 ISR (3.0x23 Xience Alpine DES), LCX 53m, OM1 40, OM3 60, RCA 23m, 40d, RPL1 30. EF 55%.    GERD (gastroesophageal reflux disease)    Heart disease    Hyperlipidemia    Hypertension    Infected epidermoid cyst 07/30/2020    Social History   Socioeconomic History   Marital status: Married    Spouse name: Not on file   Number of children: Not on file   Years of education: Not on file   Highest education level: Not on file  Occupational History   Not on file  Tobacco Use   Smoking status: Every Day    Current packs/day: 1.00    Average packs/day: 1 pack/day for 35.0 years (35.0 ttl pk-yrs)    Types: Cigarettes   Smokeless tobacco: Never   Tobacco comments:    about 3 cigarettes a day  Substance and Sexual Activity   Alcohol use: Yes    Comment: weekend socially - 8 oz of liquor w/ mixed drinks over an occasional weekend   Drug use: Never   Sexual activity: Not on file  Other Topics Concern   Not on file  Social History Narrative   Married. Live locally w/ wife.   8 children   Retired.   Enjoys fishing, sports, spending time with family.   Walks and does some light weightlifting at home 3 times a week.   Social Drivers of Corporate investment banker Strain: Not on file  Food Insecurity: Not on file  Transportation Needs: Not on file  Physical  Activity: Not on file  Stress: No Stress Concern Present (12/20/2021)   Received from [facility] - Occupational Stress Questionnaire    Feeling of Stress : Not at all  Social Connections: Unknown (02/21/2022)   Received from [facility]   Social Network    Social Network: Not on file  Intimate Partner Violence: Unknown (01/13/2022)   Received from [facility]   HITS    Physically Hurt: Not on file    Insult or Talk Down To: Not on file    Threaten Physical Harm: Not on file    Scream or Curse: Not on file    Past Surgical History:  Procedure Laterality Date   APPENDECTOMY     CARDIAC CATHETERIZATION  08/03/2014   CARDIAC CATHETERIZATION  10/10/2003    [place], [place]    COLONOSCOPY WITH PROPOFOL  N/A 11/30/2020   Procedure: COLONOSCOPY WITH PROPOFOL ;  Surgeon: Therisa Bi, MD;  Location: [facility] ENDOSCOPY;  Service: Gastroenterology;  Laterality: N/A;   CORONARY ANGIOPLASTY WITH STENT PLACEMENT  08/03/2014   CORONARY ANGIOPLASTY WITH STENT PLACEMENT  10/10/2003   [place], [place]    SHOULDER SURGERY Bilateral    Rotator Cuff Repair    Family History  Problem Relation Age of Onset   Heart disease Maternal Grandmother    Heart attack Maternal Grandmother     No Known Allergies  Current Outpatient Medications on File Prior to Visit  Medication Sig Dispense Refill   aspirin  EC 81 MG tablet Take 1 tablet (81 mg total) by mouth daily. Swallow whole. 90 tablet 3   atorvastatin  (LIPITOR) 80 MG tablet TAKE 1 TABLET (80 MG TOTAL) BY MOUTH DAILY FOR CHOLESTEROL 90 tablet 1   celecoxib  (CELEBREX ) 200 MG capsule TAKE 1 CAPSULE BY MOUTH EVERY DAY 30 capsule 2   hydrochlorothiazide  (HYDRODIURIL ) 25 MG tablet TAKE 1 TABLET BY MOUTH DAILY FOR SWELLING AND BLOOD PRESSURE. 90 tablet 1   lisinopril  (ZESTRIL ) 20 MG tablet TAKE 1 TABLET (20 MG TOTAL) BY MOUTH DAILY. FOR BLOOD PRESSURE. 90 tablet 1   metoprolol  tartrate (LOPRESSOR ) 50 MG tablet TAKE 1 TABLET BY MOUTH TWICE A DAY FOR BLOOD PRESSURE 180 tablet 1   nitroGLYCERIN  (NITROSTAT ) 0.4 MG SL tablet TAKE 1 TABLET EVERY 5 MINS up to three doses as needed for chest pain. 30 tablet 0   vitamin E 400 UNIT capsule Take 400 Units by mouth. Taking 3 times a week     No current facility-administered medications on file prior to visit.    BP 120/72   Pulse (!) 50   Temp (!) 97.2 F (36.2 C) (Temporal)   Ht 5' 8 (1.727 m)   Wt 187 lb (84.8 kg)   SpO2 98%   BMI 28.43 kg/m  Objective:   Physical Exam HENT:     Right Ear: Tympanic membrane and ear canal normal.     Left Ear: Tympanic membrane and ear canal normal.  Eyes:     Pupils: Pupils are equal, round, and reactive to light.  Cardiovascular:      Rate and Rhythm: Normal rate and regular rhythm.  Pulmonary:     Effort: Pulmonary effort is normal.     Breath sounds: Normal breath sounds.  Abdominal:     General: Bowel sounds are normal.     Palpations: Abdomen is soft.     Tenderness: There is no abdominal tenderness.  Musculoskeletal:        General: Normal range of motion.  Cervical back: Neck supple.  Skin:    General: Skin is warm and dry.  Neurological:     Mental Status: He is alert and oriented to person, place, and time.     Cranial Nerves: No cranial nerve deficit.     Deep Tendon Reflexes:     Reflex Scores:      Patellar reflexes are 2+ on the right side and 2+ on the left side. Psychiatric:        Mood and Affect: Mood normal.     Physical Exam        Assessment & Plan:  Preventative health care Assessment & Plan: Immunizations UTD. Influenza vaccine provided today.  Colonoscopy UTD, no further screening given age. PSA due and pending.  Discussed the importance of a healthy diet and regular exercise in order for weight loss, and to reduce the risk of further co-morbidity.  Exam stable. Labs pending.  Follow up in 1 year for repeat physical.    Essential hypertension Assessment & Plan: Controlled.  Continue hydrochlorothiazide  25 mg daily, lisinopril  20 mg daily.  CMP pending.   Coronary artery disease involving native coronary artery of native heart without angina pectoris Assessment & Plan: Asymptomatic.   Continue aspirin  81 mg daily, atorvastatin  80 mg daily.  Repeat lipid panel pending.   Centrilobular emphysema (HCC) Assessment & Plan: Overall stable.  Rx for albuterol  inhaler to use PRN sent to pharmacy.  Will update lung cancer screening CT scan.   Orders: -     Albuterol  Sulfate HFA; Inhale 2 puffs into the lungs every 4 (four) hours as needed for shortness of breath.  Dispense: 1 each; Refill: 0  Smoker Assessment & Plan: Referral placed for lung cancer screening  program. He agrees.   Prediabetes Assessment & Plan: Repeat A1C pending.  Commended him on dietary changes.  Orders: -     Hemoglobin A1c  Hyperlipidemia, unspecified hyperlipidemia type Assessment & Plan: Repeat lipid panel pending. Continue atorvastatin  80 mg daily  Orders: -     Lipid panel -     Comprehensive metabolic panel with GFR  Chronic pain of right knee Assessment & Plan: Controlled.  Continue Celebrex  200 mg daily PRN.   Screening for lung cancer -     Ambulatory Referral for Lung Cancer Scre  Screening for prostate cancer -     PSA, Medicare  Anemia, unspecified type -     CBC -     Ferritin    Assessment and Plan Assessment & Plan         Comer MARLA Gaskins, NP    History of Present Illness

## 2024-06-30 NOTE — Patient Instructions (Signed)
 Stop by the lab prior to leaving today. I will notify you of your results once received.   You will receive a phone call regarding the lung cancer screening program.  It was a pleasure to see you today!

## 2024-06-30 NOTE — Assessment & Plan Note (Signed)
 Overall stable.  Rx for albuterol  inhaler to use PRN sent to pharmacy.  Will update lung cancer screening CT scan.

## 2024-06-30 NOTE — Assessment & Plan Note (Signed)
 Referral placed for lung cancer screening program. He agrees.

## 2024-06-30 NOTE — Assessment & Plan Note (Signed)
Repeat lipid panel pending. Continue atorvastatin 80 mg daily.

## 2024-06-30 NOTE — Assessment & Plan Note (Signed)
 Asymptomatic.   Continue aspirin  81 mg daily, atorvastatin  80 mg daily.  Repeat lipid panel pending.

## 2024-06-30 NOTE — Assessment & Plan Note (Signed)
 Controlled.  Continue Celebrex  200 mg daily PRN.

## 2024-07-01 ENCOUNTER — Ambulatory Visit: Payer: Self-pay | Admitting: Primary Care

## 2024-07-01 ENCOUNTER — Other Ambulatory Visit (INDEPENDENT_AMBULATORY_CARE_PROVIDER_SITE_OTHER)

## 2024-07-01 ENCOUNTER — Other Ambulatory Visit: Payer: Self-pay | Admitting: Primary Care

## 2024-07-01 DIAGNOSIS — D649 Anemia, unspecified: Secondary | ICD-10-CM | POA: Diagnosis not present

## 2024-07-01 DIAGNOSIS — R7989 Other specified abnormal findings of blood chemistry: Secondary | ICD-10-CM

## 2024-07-01 LAB — IBC + FERRITIN
Ferritin: 344.2 ng/mL — ABNORMAL HIGH (ref 22.0–322.0)
Iron: 103 ug/dL (ref 42–165)
Saturation Ratios: 42 % (ref 20.0–50.0)
TIBC: 245 ug/dL — ABNORMAL LOW (ref 250.0–450.0)
Transferrin: 175 mg/dL — ABNORMAL LOW (ref 212.0–360.0)

## 2024-07-10 ENCOUNTER — Inpatient Hospital Stay

## 2024-07-10 ENCOUNTER — Encounter: Payer: Self-pay | Admitting: Oncology

## 2024-07-10 ENCOUNTER — Inpatient Hospital Stay: Attending: Oncology | Admitting: Oncology

## 2024-07-10 VITALS — BP 127/79 | HR 46 | Temp 96.8°F | Resp 18 | Ht 68.0 in | Wt 188.9 lb

## 2024-07-10 DIAGNOSIS — R7989 Other specified abnormal findings of blood chemistry: Secondary | ICD-10-CM | POA: Insufficient documentation

## 2024-07-10 DIAGNOSIS — Z87898 Personal history of other specified conditions: Secondary | ICD-10-CM | POA: Insufficient documentation

## 2024-07-10 DIAGNOSIS — F1021 Alcohol dependence, in remission: Secondary | ICD-10-CM | POA: Diagnosis not present

## 2024-07-10 DIAGNOSIS — R790 Abnormal level of blood mineral: Secondary | ICD-10-CM | POA: Insufficient documentation

## 2024-07-10 LAB — CBC WITH DIFFERENTIAL/PLATELET
Abs Immature Granulocytes: 0.03 K/uL (ref 0.00–0.07)
Basophils Absolute: 0.1 K/uL (ref 0.0–0.1)
Basophils Relative: 1 %
Eosinophils Absolute: 0.3 K/uL (ref 0.0–0.5)
Eosinophils Relative: 4 %
HCT: 42.6 % (ref 39.0–52.0)
Hemoglobin: 13.4 g/dL (ref 13.0–17.0)
Immature Granulocytes: 0 %
Lymphocytes Relative: 18 %
Lymphs Abs: 1.6 K/uL (ref 0.7–4.0)
MCH: 26.1 pg (ref 26.0–34.0)
MCHC: 31.5 g/dL (ref 30.0–36.0)
MCV: 82.9 fL (ref 80.0–100.0)
Monocytes Absolute: 0.5 K/uL (ref 0.1–1.0)
Monocytes Relative: 6 %
Neutro Abs: 6.4 K/uL (ref 1.7–7.7)
Neutrophils Relative %: 71 %
Platelets: 262 K/uL (ref 150–400)
RBC: 5.14 MIL/uL (ref 4.22–5.81)
RDW: 15.4 % (ref 11.5–15.5)
WBC: 8.8 K/uL (ref 4.0–10.5)
nRBC: 0 % (ref 0.0–0.2)

## 2024-07-10 LAB — HEPATITIS PANEL, ACUTE
HCV Ab: NONREACTIVE
Hep A IgM: NONREACTIVE
Hep B C IgM: NONREACTIVE
Hepatitis B Surface Ag: NONREACTIVE

## 2024-07-10 LAB — SEDIMENTATION RATE: Sed Rate: 25 mm/h — ABNORMAL HIGH (ref 0–20)

## 2024-07-10 LAB — IRON AND TIBC
Iron: 85 ug/dL (ref 45–182)
Saturation Ratios: 32 % (ref 17.9–39.5)
TIBC: 269 ug/dL (ref 250–450)
UIBC: 184 ug/dL

## 2024-07-10 LAB — C-REACTIVE PROTEIN: CRP: <0.5 mg/dL (ref <1.0)

## 2024-07-10 LAB — FERRITIN: Ferritin: 317 ng/mL (ref 24–336)

## 2024-07-10 NOTE — Assessment & Plan Note (Signed)
 Recommend to obtain ultrasound liver for evaluation

## 2024-07-10 NOTE — Progress Notes (Signed)
 Hematology/Oncology Consult note [phone] [phone]        REFERRING PROVIDER: Gretta Comer POUR, NP   CHIEF COMPLAINTS/REASON FOR VISIT:  Evaluation of elevated ferritin   ASSESSMENT & PLAN:   Elevated ferritin Discussed with patient that elevated ferritin can be secondary to acute or chronic inflammation, chronic liver disease, autoimmune disease or hemochromatosis.  Check CBC, iron TIBC ferritin, hepatitis panel, ESR, CRP, ANA, hemochromatosis DNA PCR.   History of alcohol use Recommend to obtain ultrasound liver for evaluation   Orders Placed This Encounter  Procedures   US  ABDOMEN LIMITED RUQ (LIVER/GB)    Standing Status:   Future    Expected Date:   07/24/2024    Expiration Date:   07/10/2025    Reason for Exam (SYMPTOM  OR DIAGNOSIS REQUIRED):   alcohol use, elevated ferritin    Preferred imaging location?:   [facility]   Ferritin    Standing Status:   Future    Number of Occurrences:   1    Expected Date:   07/10/2024    Expiration Date:   10/08/2024   Iron and TIBC    Standing Status:   Future    Number of Occurrences:   1    Expected Date:   07/10/2024    Expiration Date:   10/08/2024   CBC with Differential/Platelet    Standing Status:   Future    Number of Occurrences:   1    Expected Date:   07/10/2024    Expiration Date:   10/08/2024   Hemochromatosis DNA-PCR(c282y,h63d)    Standing Status:   Future    Number of Occurrences:   1    Expected Date:   07/10/2024    Expiration Date:   10/08/2024   Hepatitis panel, acute    Standing Status:   Future    Number of Occurrences:   1    Expected Date:   07/10/2024    Expiration Date:   07/10/2025   Sedimentation rate    Standing Status:   Future    Number of Occurrences:   1    Expected Date:   07/10/2024    Expiration Date:   10/08/2024   C-reactive protein    Standing Status:   Future    Number of Occurrences:   1    Expected Date:   07/10/2024    Expiration Date:    10/08/2024   ANA w/Reflex    Standing Status:   Future    Number of Occurrences:   1    Expected Date:   07/10/2024    Expiration Date:   07/10/2025   Follow-up in 1 month to go over results. All questions were answered. The patient knows to call the clinic with any problems, questions or concerns.  Zelphia Cap, MD, PhD [facility] Hematology Oncology 07/10/2024   HISTORY OF PRESENTING ILLNESS:   Bryan Calderon is a  78 y.o.  male with PMH listed below was seen in consultation at the request of  Gretta Comer POUR, NP  for evaluation of elevated ferritin.  He quit alcohol three years ago after a history of heavy consumption and denies any current alcohol use.  Patient denies jaundice, abdominal pain, unintentional weight loss, nausea vomiting diarrhea.  He experiences stiffness in his shoulders and knees, which is relieved by ibuprofen. He takes Celebrex  for joint pain, which began after operations on both shoulders.  06/28/2024, ferritin 344.6. 07/01/2024, TIBC 245, ferritin 344.2, transferrin 175,  saturation 42.  MEDICAL HISTORY:  Past Medical History:  Diagnosis Date   Alcohol abuse    Coronary artery disease    a. ~ 2005 s/p PCI to the LAD in [place], [place]; b. 07/2014 PCI: LM nl, LAD 90 ISR (3.0x23 Xience Alpine DES), LCX 35m, OM1 40, OM3 60, RCA 21m, 40d, RPL1 30. EF 55%.   GERD (gastroesophageal reflux disease)    Heart disease    Hyperlipidemia    Hypertension    Infected epidermoid cyst 07/30/2020    SURGICAL HISTORY: Past Surgical History:  Procedure Laterality Date   APPENDECTOMY     CARDIAC CATHETERIZATION  08/03/2014   CARDIAC CATHETERIZATION  10/10/2003   [place], [place]    COLONOSCOPY WITH PROPOFOL  N/A 11/30/2020   Procedure: COLONOSCOPY WITH PROPOFOL ;  Surgeon: Therisa Bi, MD;  Location: [facility] ENDOSCOPY;  Service: Gastroenterology;  Laterality: N/A;   CORONARY ANGIOPLASTY WITH STENT PLACEMENT  08/03/2014   CORONARY ANGIOPLASTY WITH STENT PLACEMENT  10/10/2003    [place], [place]    SHOULDER SURGERY Bilateral    Rotator Cuff Repair    SOCIAL HISTORY: Social History   Socioeconomic History   Marital status: Married    Spouse name: Scientist, research (physical sciences)   Number of children: Not on file   Years of education: Not on file   Highest education level: Not on file  Occupational History   Not on file  Tobacco Use   Smoking status: Every Day    Current packs/day: 1.00    Average packs/day: 1 pack/day for 35.0 years (35.0 ttl pk-yrs)    Types: Cigarettes   Smokeless tobacco: Never   Tobacco comments:    about 3 cigarettes a day  Vaping Use   Vaping status: Never Used  Substance and Sexual Activity   Alcohol use: Not Currently    Comment: weekend socially - 8 oz of liquor w/ mixed drinks over an occasional weekend   Drug use: Never   Sexual activity: Yes  Other Topics Concern   Not on file  Social History Narrative   Married. Live locally w/ wife.   8 children   Retired.   Enjoys fishing, sports, spending time with family.   Walks and does some light weightlifting at home 3 times a week.   Social Drivers of Corporate investment banker Strain: Not on file  Food Insecurity: No Food Insecurity (07/10/2024)   Hunger Vital Sign    Worried About Running Out of Food in the Last Year: Never true    Ran Out of Food in the Last Year: Never true  Transportation Needs: No Transportation Needs (07/10/2024)   PRAPARE - Administrator, Civil Service (Medical): No    Lack of Transportation (Non-Medical): No  Physical Activity: Not on file  Stress: No Stress Concern Present (12/20/2021)   Received from [facility] - Occupational Stress Questionnaire    Feeling of Stress : Not at all  Social Connections: Unknown (02/21/2022)   Received from [facility]   Social Network    Social Network: Not on file  Intimate Partner Violence: Not At Risk (07/10/2024)   Humiliation, Afraid, Rape, and Kick questionnaire     Fear of Current or Ex-Partner: No    Emotionally Abused: No    Physically Abused: No    Sexually Abused: No    FAMILY HISTORY: Family History  Problem Relation Age of Onset   Cancer Maternal Aunt    Heart disease Maternal  Grandmother    Heart attack Maternal Grandmother     ALLERGIES:  has no known allergies.  MEDICATIONS:  Current Outpatient Medications  Medication Sig Dispense Refill   albuterol  (VENTOLIN  HFA) 108 (90 Base) MCG/ACT inhaler Inhale 2 puffs into the lungs every 4 (four) hours as needed for shortness of breath. 1 each 0   aspirin  EC 81 MG tablet Take 1 tablet (81 mg total) by mouth daily. Swallow whole. 90 tablet 3   atorvastatin  (LIPITOR) 80 MG tablet TAKE 1 TABLET (80 MG TOTAL) BY MOUTH DAILY FOR CHOLESTEROL 90 tablet 1   celecoxib  (CELEBREX ) 200 MG capsule TAKE 1 CAPSULE BY MOUTH EVERY DAY 30 capsule 2   hydrochlorothiazide  (HYDRODIURIL ) 25 MG tablet TAKE 1 TABLET BY MOUTH DAILY FOR SWELLING AND BLOOD PRESSURE. 90 tablet 1   lisinopril  (ZESTRIL ) 20 MG tablet TAKE 1 TABLET (20 MG TOTAL) BY MOUTH DAILY. FOR BLOOD PRESSURE. 90 tablet 1   metoprolol  tartrate (LOPRESSOR ) 50 MG tablet TAKE 1 TABLET BY MOUTH TWICE A DAY FOR BLOOD PRESSURE 180 tablet 1   nitroGLYCERIN  (NITROSTAT ) 0.4 MG SL tablet TAKE 1 TABLET EVERY 5 MINS up to three doses as needed for chest pain. 30 tablet 0   vitamin E 400 UNIT capsule Take 400 Units by mouth. Taking 3 times a week     No current facility-administered medications for this visit.    Review of Systems  Constitutional:  Negative for appetite change, chills, fatigue and fever.  HENT:   Negative for hearing loss and voice change.   Eyes:  Negative for eye problems.  Respiratory:  Negative for chest tightness and cough.   Cardiovascular:  Negative for chest pain.  Gastrointestinal:  Negative for abdominal distention, abdominal pain and blood in stool.  Endocrine: Negative for hot flashes.  Genitourinary:  Negative for difficulty  urinating and frequency.   Musculoskeletal:  Positive for arthralgias.  Skin:  Negative for itching and rash.  Neurological:  Negative for extremity weakness.  Hematological:  Negative for adenopathy.  Psychiatric/Behavioral:  Negative for confusion.    PHYSICAL EXAMINATION:  Vitals:   07/10/24 1153  BP: 127/79  Pulse: (!) 46  Resp: 18  Temp: (!) 96.8 F (36 C)  SpO2: 100%   Filed Weights   07/10/24 1153  Weight: 188 lb 14.4 oz (85.7 kg)    Physical Exam Constitutional:      General: He is not in acute distress. HENT:     Head: Normocephalic and atraumatic.  Eyes:     General: No scleral icterus. Cardiovascular:     Rate and Rhythm: Normal rate and regular rhythm.     Heart sounds: Normal heart sounds.  Pulmonary:     Effort: Pulmonary effort is normal. No respiratory distress.     Breath sounds: No wheezing.  Abdominal:     General: Bowel sounds are normal. There is no distension.     Palpations: Abdomen is soft.  Musculoskeletal:        General: No deformity. Normal range of motion.     Cervical back: Normal range of motion and neck supple.  Skin:    General: Skin is warm and dry.     Findings: No erythema or rash.  Neurological:     Mental Status: He is alert and oriented to person, place, and time. Mental status is at baseline.     Cranial Nerves: No cranial nerve deficit.     Coordination: Coordination normal.  Psychiatric:  Mood and Affect: Mood normal.     LABORATORY DATA:  I have reviewed the data as listed    Latest Ref Rng & Units 07/10/2024   12:10 PM 06/30/2024   11:56 AM 11/29/2022   12:03 PM  CBC  WBC 4.0 - 10.5 K/uL 8.8  7.1  8.9   Hemoglobin 13.0 - 17.0 g/dL 86.5  86.9  88.4   Hematocrit 39.0 - 52.0 % 42.6  39.5  34.6   Platelets 150 - 400 K/uL 262  254.0  231.0       Latest Ref Rng & Units 06/30/2024   11:56 AM 11/29/2022   12:03 PM 11/30/2021   11:19 AM  CMP  Glucose 70 - 99 mg/dL 877  884  892   BUN 6 - 23 mg/dL 17  13  20     Creatinine 0.40 - 1.50 mg/dL 9.13  9.07  9.20   Sodium 135 - 145 mEq/L 141  142  141   Potassium 3.5 - 5.1 mEq/L 3.8  3.7  3.5   Chloride 96 - 112 mEq/L 103  103  102   CO2 19 - 32 mEq/L 31  26  34   Calcium  8.4 - 10.5 mg/dL 9.9  9.1  9.3   Total Protein 6.0 - 8.3 g/dL 6.8  6.0  6.7   Total Bilirubin 0.2 - 1.2 mg/dL 0.6  0.7  0.7   Alkaline Phos 39 - 117 U/L 67  66  66   AST 0 - 37 U/L 13  20  15    ALT 0 - 53 U/L 8  13  11        RADIOGRAPHIC STUDIES: I have personally reviewed the radiological images as listed and agreed with the findings in the report. No results found.

## 2024-07-10 NOTE — Assessment & Plan Note (Signed)
 Discussed with patient that elevated ferritin can be secondary to acute or chronic inflammation, chronic liver disease, autoimmune disease or hemochromatosis.  Check CBC, iron TIBC ferritin, hepatitis panel, ESR, CRP, ANA, hemochromatosis DNA PCR.

## 2024-07-11 LAB — ANA W/REFLEX: Anti Nuclear Antibody (ANA): NEGATIVE

## 2024-07-14 LAB — HEMOCHROMATOSIS DNA-PCR(C282Y,H63D)

## 2024-07-23 ENCOUNTER — Ambulatory Visit
Admission: RE | Admit: 2024-07-23 | Discharge: 2024-07-23 | Disposition: A | Discharge status: Home / Self Care | Source: Ambulatory Visit | Attending: Oncology | Admitting: Oncology

## 2024-07-23 DIAGNOSIS — R7989 Other specified abnormal findings of blood chemistry: Secondary | ICD-10-CM | POA: Insufficient documentation

## 2024-08-13 ENCOUNTER — Encounter: Payer: Self-pay | Admitting: Oncology

## 2024-08-13 ENCOUNTER — Inpatient Hospital Stay: Attending: Oncology | Admitting: Oncology

## 2024-09-25 ENCOUNTER — Ambulatory Visit: Admission: RE | Admit: 2024-09-25 | Discharge: 2024-09-25 | Attending: Acute Care | Admitting: Acute Care

## 2024-09-25 DIAGNOSIS — Z87891 Personal history of nicotine dependence: Secondary | ICD-10-CM | POA: Insufficient documentation

## 2024-09-25 DIAGNOSIS — F17210 Nicotine dependence, cigarettes, uncomplicated: Secondary | ICD-10-CM | POA: Diagnosis present

## 2024-09-25 DIAGNOSIS — Z122 Encounter for screening for malignant neoplasm of respiratory organs: Secondary | ICD-10-CM | POA: Diagnosis present

## 2024-10-06 ENCOUNTER — Telehealth: Payer: Self-pay | Admitting: *Deleted

## 2024-10-06 NOTE — Telephone Encounter (Signed)
 Spoke with pt and reviewed lung screening CT results. No suspicious lung nodules notes. Thyroid nodule noted with recommendation for thyroid ultrasound. Advised pt that I have sent results to his PCP and they will contact pt to schedule ultrasound. I advised pt if he doesn't here back from PCP office to call them to get this scheduled.  Due to medicare age guidelines, this is the last scan pt will have through the lung screening program.

## 2024-10-07 ENCOUNTER — Telehealth: Payer: Self-pay | Admitting: Primary Care

## 2024-10-07 DIAGNOSIS — E041 Nontoxic single thyroid nodule: Secondary | ICD-10-CM

## 2024-10-07 NOTE — Telephone Encounter (Addendum)
 Please call patient:  A thyroid nodule was found on his CT scan for lung cancer screening. We need to obtain a thyroid ultrasound. Is he agreeable? Which location? [place] or [facility]?     ----- Message from Nurse Karna SQUIBB, RN sent at 10/06/2024  9:37 AM EST ----- Please see recommendation for thyroid ultrasound. Pt is aware your office will contact him to get this scheduled

## 2024-10-07 NOTE — Telephone Encounter (Signed)
 Spoke with pt relaying Bryan Calderon's message. Pt verbalizes understanding, agrees to thyroid US  and prefers [place] area. He didn't specify a location.

## 2024-10-07 NOTE — Telephone Encounter (Signed)
 Noted, orders placed.

## 2024-10-20 ENCOUNTER — Telehealth: Payer: Self-pay | Admitting: Primary Care

## 2024-10-20 NOTE — Telephone Encounter (Signed)
 Patient stopped in office to check on status of US  for Thyroid.  States has not heard from their office to schedule yet.

## 2024-11-03 ENCOUNTER — Ambulatory Visit

## 2024-11-06 ENCOUNTER — Ambulatory Visit
Admission: RE | Admit: 2024-11-06 | Discharge: 2024-11-06 | Disposition: A | Discharge status: Home / Self Care | Source: Ambulatory Visit | Attending: Primary Care | Admitting: Primary Care

## 2024-11-06 DIAGNOSIS — E041 Nontoxic single thyroid nodule: Secondary | ICD-10-CM | POA: Insufficient documentation

## 2024-11-07 ENCOUNTER — Ambulatory Visit: Payer: Self-pay | Admitting: Primary Care

## 2024-11-07 DIAGNOSIS — E041 Nontoxic single thyroid nodule: Secondary | ICD-10-CM

## 2024-12-03 ENCOUNTER — Ambulatory Visit (HOSPITAL_COMMUNITY)
